# Patient Record
Sex: Male | Born: 1950
Health system: Southern US, Community
[De-identification: ages and names within clinical notes are randomized; demographics above are authoritative.]

## PROBLEM LIST (undated history)

## (undated) DIAGNOSIS — H33311 Horseshoe tear of retina without detachment, right eye: Secondary | ICD-10-CM

## (undated) DIAGNOSIS — E785 Hyperlipidemia, unspecified: Secondary | ICD-10-CM

## (undated) DIAGNOSIS — Z86010 Personal history of colonic polyps: Secondary | ICD-10-CM

## (undated) DIAGNOSIS — Z860101 Personal history of adenomatous and serrated colon polyps: Secondary | ICD-10-CM

## (undated) DIAGNOSIS — F419 Anxiety disorder, unspecified: Secondary | ICD-10-CM

## (undated) DIAGNOSIS — F32A Depression, unspecified: Secondary | ICD-10-CM

## (undated) DIAGNOSIS — K219 Gastro-esophageal reflux disease without esophagitis: Secondary | ICD-10-CM

## (undated) HISTORY — DX: Horseshoe tear of retina without detachment, right eye: H33.311

## (undated) HISTORY — DX: Anxiety disorder, unspecified: F41.9

## (undated) HISTORY — PX: WISDOM TOOTH EXTRACTION: SHX21

## (undated) HISTORY — DX: Depression, unspecified: F32.A

## (undated) HISTORY — PX: LACERATION REPAIR: SHX5168

## (undated) HISTORY — DX: Gastro-esophageal reflux disease without esophagitis: K21.9

## (undated) HISTORY — DX: Personal history of colonic polyps: Z86.010

## (undated) HISTORY — DX: Hyperlipidemia, unspecified: E78.5

## (undated) HISTORY — DX: Personal history of adenomatous and serrated colon polyps: Z86.0101

---

## 2003-10-14 ENCOUNTER — Encounter: Payer: Self-pay | Admitting: Gastroenterology

## 2003-11-09 ENCOUNTER — Ambulatory Visit (HOSPITAL_COMMUNITY): Admission: RE | Admit: 2003-11-09 | Discharge: 2003-11-09 | Payer: Self-pay | Admitting: Gastroenterology

## 2003-11-09 ENCOUNTER — Encounter (INDEPENDENT_AMBULATORY_CARE_PROVIDER_SITE_OTHER): Payer: Self-pay | Admitting: *Deleted

## 2003-11-09 ENCOUNTER — Encounter: Payer: Self-pay | Admitting: Gastroenterology

## 2008-05-27 ENCOUNTER — Encounter: Payer: Self-pay | Admitting: Family Medicine

## 2008-06-29 ENCOUNTER — Ambulatory Visit: Payer: Self-pay | Admitting: Family Medicine

## 2008-06-29 DIAGNOSIS — E785 Hyperlipidemia, unspecified: Secondary | ICD-10-CM | POA: Insufficient documentation

## 2009-01-31 ENCOUNTER — Ambulatory Visit: Payer: Self-pay | Admitting: Family Medicine

## 2009-02-03 ENCOUNTER — Encounter: Payer: Self-pay | Admitting: Family Medicine

## 2009-02-03 LAB — CONVERTED CEMR LAB
ALT: 17 units/L (ref 0–53)
AST: 23 units/L (ref 0–37)
Alkaline Phosphatase: 46 units/L (ref 39–117)
Basophils Absolute: 0 10*3/uL (ref 0.0–0.1)
CO2: 30 meq/L (ref 19–32)
Chloride: 112 meq/L (ref 96–112)
Creatinine, Ser: 1 mg/dL (ref 0.4–1.5)
Eosinophils Relative: 1 % (ref 0.0–5.0)
Glucose, Bld: 93 mg/dL (ref 70–99)
Hemoglobin: 13.8 g/dL (ref 13.0–17.0)
LDL Cholesterol: 111 mg/dL — ABNORMAL HIGH (ref 0–99)
Lymphocytes Relative: 28.6 % (ref 12.0–46.0)
Monocytes Relative: 6.9 % (ref 3.0–12.0)
Neutro Abs: 3.3 10*3/uL (ref 1.4–7.7)
Platelets: 162 10*3/uL (ref 150.0–400.0)
RDW: 12.5 % (ref 11.5–14.6)
Specific Gravity, Urine: 1.01 (ref 1.000–1.030)
Total Bilirubin: 1.6 mg/dL — ABNORMAL HIGH (ref 0.3–1.2)
Total CHOL/HDL Ratio: 4
Total Protein, Urine: NEGATIVE mg/dL
Triglycerides: 98 mg/dL (ref 0.0–149.0)
Urine Glucose: NEGATIVE mg/dL
WBC: 5 10*3/uL (ref 4.5–10.5)

## 2009-02-07 ENCOUNTER — Ambulatory Visit: Payer: Self-pay | Admitting: Family Medicine

## 2009-02-17 ENCOUNTER — Ambulatory Visit: Payer: Self-pay | Admitting: Gastroenterology

## 2009-09-19 ENCOUNTER — Encounter (INDEPENDENT_AMBULATORY_CARE_PROVIDER_SITE_OTHER): Payer: Self-pay | Admitting: *Deleted

## 2009-09-19 ENCOUNTER — Ambulatory Visit: Payer: Self-pay | Admitting: Gastroenterology

## 2009-10-03 ENCOUNTER — Ambulatory Visit: Payer: Self-pay | Admitting: Gastroenterology

## 2009-10-03 HISTORY — PX: COLONOSCOPY: SHX174

## 2009-10-03 LAB — HM COLONOSCOPY

## 2010-09-25 NOTE — Procedures (Signed)
Summary: Herbert Moors MD  Herbert Moors MD   Imported By: Lester [place] 09/28/2009 08:38:45  _____________________________________________________________________  External Attachment:    Type:   Image     Comment:   External Document

## 2010-09-25 NOTE — Procedures (Signed)
Summary: Colonoscopy  Patient: Christian Martin Note: All result statuses are Final unless otherwise noted.  Tests: (1) Colonoscopy (COL)   COL Colonoscopy           DONE     [facility]     [address]     [place], [place]  [address]           COLONOSCOPY PROCEDURE REPORT           PATIENT:  Christian, Martin  MR#:  [id number]     BIRTHDATE:  04-22-1951, 58 yrs. old  GENDER:  male           ENDOSCOPIST:  Barbette Hair. Arlyce Dice, MD     Referred by:  Tera Mater Clent Ridges, M.D.           PROCEDURE DATE:  10/03/2009     PROCEDURE:  Colonoscopy, Diagnostic     ASA CLASS:  Class I     INDICATIONS:  history of pre-cancerous (adenomatous) colon polyps                 MEDICATIONS:   Fentanyl 75 mcg IV, Versed 7 mg IV           DESCRIPTION OF PROCEDURE:   After the risks benefits and     alternatives of the procedure were thoroughly explained, informed     consent was obtained.  Digital rectal exam was performed and     revealed no abnormalities.   The LB CF-[id number] endoscope     was introduced through the anus and advanced to the cecum, which     was identified by both the appendix and ileocecal valve, without     limitations.  The quality of the prep was excellent, using     MoviPrep.  The instrument was then slowly withdrawn as the colon     was fully examined.     <<PROCEDUREIMAGES>>           FINDINGS:  A normal appearing cecum, ileocecal valve, and     appendiceal orifice were identified. The ascending, hepatic     flexure, transverse, splenic flexure, descending, sigmoid colon,     and rectum appeared unremarkable (see image1, image2, image4,     image6, image7, image9, image12, image13, image14, and image15).     Retroflexed views in the rectum revealed no abnormalities.    The     scope was then withdrawn from the patient and the procedure     completed.           COMPLICATIONS:  None           ENDOSCOPIC IMPRESSION:     1) Normal colon     RECOMMENDATIONS:     1)  colonoscopy in 7 years           REPEAT EXAM:  In 7 year(s) for Colonoscopy.           ______________________________     Barbette Hair. Arlyce Dice, MD           CC:           n.     eSIGNED:   Barbette Hair. Kaplan at 10/03/2009 08:29 AM           Katha Cabal, [id number]  Note: An exclamation mark (!) indicates a result that was not dispersed into the flowsheet. Document Creation Date: 10/03/2009 8:29 AM _______________________________________________________________________  (1) Order result status: Final Collection or observation date-time: 10/03/2009 08:24 Requested date-time:  Receipt date-time:  Reported date-time:  Referring Physician:   Ordering Physician: Melvia Heaps [phone]) Specimen Source:  Source: Launa Grill Order Number: [id number] Lab site:   Appended Document: Colonoscopy    Clinical Lists Changes  Observations: Added new observation of COLONNXTDUE: 09/2016 (10/03/2009 11:20)

## 2010-09-25 NOTE — Miscellaneous (Signed)
Summary: [facility] Previsit/prep  Clinical Lists Changes  Medications: Added new medication of MOVIPREP 100 GM  SOLR (PEG-KCL-NACL-NASULF-NA ASC-C) As per prep instructions. - Signed Rx of MOVIPREP 100 GM  SOLR (PEG-KCL-NACL-NASULF-NA ASC-C) As per prep instructions.;  #1 x 0;  Signed;  Entered by: Wyona Almas RN;  Authorized by: Louis Meckel MD;  Method used: Electronically to [facility]  [address] [phone]*, [address] [place], [place]  [address], Ph: [phone] or [phone], Fax: [phone] Observations: Added new observation of ALLERGY REV: Done (09/19/2009 7:55)    Prescriptions: MOVIPREP 100 GM  SOLR (PEG-KCL-NACL-NASULF-NA ASC-C) As per prep instructions.  #1 x 0   Entered by:   Wyona Almas RN   Authorized by:   Louis Meckel MD   Signed by:   Wyona Almas RN on 09/19/2009   Method used:   [facility]ronically to        [facility]  [address] [phone]* (retail)       [address]       [place], [place]  [address]       Ph: [phone] or [phone]       Fax: [phone]   RxID:   [id number]

## 2010-09-25 NOTE — Letter (Signed)
Summary: [facility]  [facility]   Imported By: Lester Lucas 09/28/2009 08:31:14  _____________________________________________________________________  External Attachment:    Type:   Image     Comment:   External Document

## 2010-09-25 NOTE — Letter (Signed)
Summary: [facility] Instructions  [facility]  [address] [place], [place] [address]   Phone: [phone]  Fax: [phone]       Christian Martin    [birth year]    MRN: [id number]       Procedure Day Dorna Bloom:  Jake Shark  10/03/09     Arrival Time:  7:30AM     Procedure Time:  8:00AM     Location of Procedure:                    _ X_  [facility] (4th Floor)    PREPARATION FOR COLONOSCOPY WITH MIRALAX  Starting 5 days prior to your procedure 09/28/09 do not eat nuts, seeds, popcorn, corn, beans, peas,  salads, or any raw vegetables.  Do not take any fiber supplements (e.g. Metamucil, Citrucel, and Benefiber). ____________________________________________________________________________________________________   THE DAY BEFORE YOUR PROCEDURE         DATE: 10/02/09 DAY: MONDAY  1   Drink clear liquids the entire day-NO SOLID FOOD  2   Do not drink anything colored red or purple.  Avoid juices with pulp.  No orange juice.  3   Drink at least 64 oz. (8 glasses) of fluid/clear liquids during the day to prevent dehydration and help the prep work efficiently.  CLEAR LIQUIDS INCLUDE: Water Jello Ice Popsicles Tea (sugar ok, no milk/cream) Powdered fruit flavored drinks Coffee (sugar ok, no milk/cream) Gatorade Juice: apple, white grape, white cranberry  Lemonade Clear bullion, consomm, broth Carbonated beverages (any kind) Strained chicken noodle soup Hard Candy  4   Mix the entire bottle of Miralax with 64 oz. of Gatorade/Powerade in the morning and put in the refrigerator to chill.  5   At 3:00 pm take 2 Dulcolax/Bisacodyl tablets.  6   At 4:30 pm take one Reglan/Metoclopramide tablet.  7  Starting at 5:00 pm drink one 8 oz glass of the Miralax mixture every 15-20 minutes until you have finished drinking the entire 64 oz.  You should finish drinking prep around 7:30 or 8:00 pm.  8   If you are nauseated, you may take the 2nd Reglan/Metoclopramide tablet  at 6:30 pm.        9    At 8:00 pm take 2 more DULCOLAX/Bisacodyl tablets.     THE DAY OF YOUR PROCEDURE      DATE:  10/03/09 DAY: Jake Shark  You may drink clear liquids until 6:00AM  (2 HOURS BEFORE PROCEDURE).   MEDICATION INSTRUCTIONS  Unless otherwise instructed, you should take regular prescription medications with a small sip of water as early as possible the morning of your procedure.           OTHER INSTRUCTIONS  You will need a responsible adult at least 62 years of age to accompany you and drive you home.   This person must remain in the waiting room during your procedure.  Wear loose fitting clothing that is easily removed.  Leave jewelry and other valuables at home.  However, you may wish to bring a book to read or an iPod/MP3 player to listen to music as you wait for your procedure to start.  Remove all body piercing jewelry and leave at home.  Total time from sign-in until discharge is approximately 2-3 hours.  You should go home directly after your procedure and rest.  You can resume normal activities the day after your procedure.  The day of your procedure you should not:   Drive  Make legal decisions   Operate machinery   Drink alcohol   Return to work  You will receive specific instructions about eating, activities and medications before you leave.   The above instructions have been reviewed and explained to me by   Wyona Almas RN  September 19, 2009 8:24 AM     I fully understand and can verbalize these instructions _____________________________ Date _______

## 2010-09-26 DEATH — deceased

## 2010-12-12 ENCOUNTER — Other Ambulatory Visit (INDEPENDENT_AMBULATORY_CARE_PROVIDER_SITE_OTHER): Payer: BC Managed Care – PPO | Admitting: Family Medicine

## 2010-12-12 DIAGNOSIS — Z0000 Encounter for general adult medical examination without abnormal findings: Secondary | ICD-10-CM

## 2010-12-12 LAB — BASIC METABOLIC PANEL
CO2: 30 mEq/L (ref 19–32)
Chloride: 103 mEq/L (ref 96–112)
GFR: 82.38 mL/min (ref >60.00)
Glucose, Bld: 88 mg/dL (ref 70–99)
Potassium: 4.3 mEq/L (ref 3.5–5.1)
Sodium: 142 mEq/L (ref 135–145)

## 2010-12-12 LAB — CBC WITH DIFFERENTIAL/PLATELET
Basophils Absolute: 0 10*3/uL (ref 0.0–0.1)
HCT: 42.3 % (ref 39.0–52.0)
Hemoglobin: 14.5 g/dL (ref 13.0–17.0)
Lymphs Abs: 1.5 10*3/uL (ref 0.7–4.0)
MCHC: 34.3 g/dL (ref 30.0–36.0)
MCV: 94.1 fl (ref 78.0–100.0)
Monocytes Relative: 7.1 % (ref 3.0–12.0)
Neutro Abs: 3.3 10*3/uL (ref 1.4–7.7)
RDW: 12.6 % (ref 11.5–14.6)

## 2010-12-12 LAB — LIPID PANEL
Cholesterol: 190 mg/dL (ref 0–200)
VLDL: 14 mg/dL (ref 0.0–40.0)

## 2010-12-12 LAB — HEPATIC FUNCTION PANEL
AST: 21 U/L (ref 0–37)
Albumin: 4.1 g/dL (ref 3.5–5.2)

## 2010-12-13 ENCOUNTER — Telehealth: Payer: Self-pay

## 2010-12-13 NOTE — Telephone Encounter (Signed)
Pt aware.

## 2010-12-13 NOTE — Telephone Encounter (Signed)
Message copied by Madison Hickman on Thu Dec 13, 2010 10:08 AM ------      Message from: Dwaine Deter      Created: Thu Dec 13, 2010  8:20 AM       Normal except elevated chol. Watch the diet

## 2010-12-18 ENCOUNTER — Encounter: Payer: Self-pay | Admitting: Family Medicine

## 2010-12-19 ENCOUNTER — Encounter: Payer: Self-pay | Admitting: Family Medicine

## 2010-12-19 ENCOUNTER — Ambulatory Visit (INDEPENDENT_AMBULATORY_CARE_PROVIDER_SITE_OTHER): Payer: BC Managed Care – PPO | Admitting: Family Medicine

## 2010-12-19 VITALS — BP 106/74 | HR 100 | Temp 99.1°F | Ht 70.5 in | Wt 162.0 lb

## 2010-12-19 DIAGNOSIS — Z136 Encounter for screening for cardiovascular disorders: Secondary | ICD-10-CM

## 2010-12-19 DIAGNOSIS — Z0000 Encounter for general adult medical examination without abnormal findings: Secondary | ICD-10-CM

## 2010-12-19 DIAGNOSIS — Z86010 Personal history of colonic polyps: Secondary | ICD-10-CM

## 2010-12-19 LAB — POCT URINALYSIS DIPSTICK
Bilirubin, UA: NEGATIVE
Blood, UA: NEGATIVE
Glucose, UA: NEGATIVE
Ketones, UA: NEGATIVE
Leukocytes, UA: NEGATIVE
Nitrite, UA: NEGATIVE
Protein, UA: NEGATIVE
Spec Grav, UA: 1.015
Urobilinogen, UA: 0.2
pH, UA: 5

## 2010-12-19 NOTE — Progress Notes (Signed)
  Subjective:    Patient ID: Christian Martin, male    DOB: 31-Jan-1951, 60 y.o.   MRN: [id number]  HPI 60 yr old male for a cpx. He feels fine and has no complaints.   Review of Systems  Constitutional: Negative.   HENT: Negative.   Eyes: Negative.   Respiratory: Negative.   Cardiovascular: Negative.   Gastrointestinal: Negative.   Genitourinary: Negative.   Musculoskeletal: Negative.   Skin: Negative.   Neurological: Negative.   Hematological: Negative.   Psychiatric/Behavioral: Negative.        Objective:   Physical Exam  Constitutional: He is oriented to person, place, and time. He appears well-developed and well-nourished. No distress.  HENT:  Head: Normocephalic and atraumatic.  Right Ear: External ear normal.  Left Ear: External ear normal.  Nose: Nose normal.  Mouth/Throat: Oropharynx is clear and moist. No oropharyngeal exudate.  Eyes: Conjunctivae and EOM are normal. Pupils are equal, round, and reactive to light. Right eye exhibits no discharge. Left eye exhibits no discharge. No scleral icterus.  Neck: Neck supple. No JVD present. No tracheal deviation present. No thyromegaly present.  Cardiovascular: Normal rate, regular rhythm, normal heart sounds and intact distal pulses.  Exam reveals no gallop and no friction rub.   No murmur heard.      EKG normal  Pulmonary/Chest: Effort normal and breath sounds normal. No respiratory distress. He has no wheezes. He has no rales. He exhibits no tenderness.  Abdominal: Soft. Bowel sounds are normal. He exhibits no distension and no mass. There is no tenderness. There is no rebound and no guarding.  Genitourinary: Rectum normal, prostate normal and penis normal. Guaiac negative stool. No penile tenderness.  Musculoskeletal: Normal range of motion. He exhibits no edema and no tenderness.  Lymphadenopathy:    He has no cervical adenopathy.  Neurological: He is alert and oriented to person, place, and time. He has normal reflexes.  No cranial nerve deficit. He exhibits normal muscle tone. Coordination normal.  Skin: Skin is warm and dry. No rash noted. He is not diaphoretic. No erythema. No pallor.  Psychiatric: He has a normal mood and affect. His behavior is normal. Judgment and thought content normal.          Assessment & Plan:  Doing well. Get some exercise

## 2011-01-11 NOTE — Op Note (Signed)
NAME:  Christian Martin, Christian Martin                         ACCOUNT NO.:  [id number]   MEDICAL RECORD NO.:  [id number]                   PATIENT TYPE:  AMB   LOCATION:  ENDO                                 FACILITY:  [facility]   PHYSICIAN:  Graylin Shiver, M.D.                DATE OF BIRTH:  [birth year]   DATE OF PROCEDURE:  11/09/2003  DATE OF DISCHARGE:                                 OPERATIVE REPORT   PROCEDURE:  Colonoscopy with biopsy.   INDICATIONS:  Screening.   Informed consent was obtained after explanation of the risks of bleeding,  infection, and perforation.   PREMEDICATION:  Fentanyl 60 mcg IV, Versed 7 mg IV.   DESCRIPTION OF PROCEDURE:  With the patient in the left lateral decubitus  position, a rectal exam was performed and no masses were felt.  The Olympus  colonoscope was inserted into the rectum and advanced around the colon to  the cecum.  Cecal landmarks were identified.  The cecum and ascending colon  were normal.  The transverse colon was normal.  The descending colon was  normal.  In the proximal sigmoid colon there was a small 2 mm sessile polyp  biopsied off with cold forceps.  The rectum was normal .  He tolerated the  procedure well without complications.   IMPRESSION:  Small sigmoid polyp.   PLAN:  The pathology will be checked.                                               Graylin Shiver, M.D.    SFG/MEDQ  D:  11/09/2003  T:  11/10/2003  Job:  [id number]   cc:   Caryn Bee L. Little, M.D.  [address]  [place]  [place] [address]  Fax: [phone]

## 2011-12-13 ENCOUNTER — Other Ambulatory Visit (INDEPENDENT_AMBULATORY_CARE_PROVIDER_SITE_OTHER): Payer: BC Managed Care – PPO

## 2011-12-13 DIAGNOSIS — Z0000 Encounter for general adult medical examination without abnormal findings: Secondary | ICD-10-CM

## 2011-12-13 LAB — LIPID PANEL
HDL: 48.2 mg/dL (ref >39.00)
Triglycerides: 85 mg/dL (ref 0.0–149.0)
VLDL: 17 mg/dL (ref 0.0–40.0)

## 2011-12-13 LAB — HEPATIC FUNCTION PANEL
ALT: 24 U/L (ref 0–53)
AST: 25 U/L (ref 0–37)
Alkaline Phosphatase: 57 U/L (ref 39–117)
Bilirubin, Direct: 0.2 mg/dL (ref 0.0–0.3)
Total Bilirubin: 1.7 mg/dL — ABNORMAL HIGH (ref 0.3–1.2)
Total Protein: 6.9 g/dL (ref 6.0–8.3)

## 2011-12-13 LAB — CBC WITH DIFFERENTIAL/PLATELET
Basophils Relative: 0.3 % (ref 0.0–3.0)
Eosinophils Relative: 0.5 % (ref 0.0–5.0)
Monocytes Relative: 8.2 % (ref 3.0–12.0)
Neutrophils Relative %: 62.6 % (ref 43.0–77.0)
Platelets: 173 10*3/uL (ref 150.0–400.0)
RBC: 4.62 Mil/uL (ref 4.22–5.81)
WBC: 6.7 10*3/uL (ref 4.5–10.5)

## 2011-12-13 LAB — POCT URINALYSIS DIPSTICK
Bilirubin, UA: NEGATIVE
Blood, UA: NEGATIVE
Glucose, UA: NEGATIVE
Ketones, UA: NEGATIVE
Nitrite, UA: NEGATIVE
Spec Grav, UA: <=1.005

## 2011-12-13 LAB — BASIC METABOLIC PANEL
BUN: 22 mg/dL (ref 6–23)
Chloride: 103 mEq/L (ref 96–112)
Creatinine, Ser: 1.2 mg/dL (ref 0.4–1.5)
GFR: 68.27 mL/min (ref >60.00)
Potassium: 4.6 mEq/L (ref 3.5–5.1)

## 2011-12-13 LAB — LDL CHOLESTEROL, DIRECT: Direct LDL: 137 mg/dL

## 2011-12-17 NOTE — Progress Notes (Signed)
Quick Note:  Pt informed on personally identified VM ______ 

## 2011-12-23 ENCOUNTER — Encounter: Payer: BC Managed Care – PPO | Admitting: Family Medicine

## 2011-12-27 ENCOUNTER — Ambulatory Visit (INDEPENDENT_AMBULATORY_CARE_PROVIDER_SITE_OTHER): Payer: BC Managed Care – PPO | Admitting: Family Medicine

## 2011-12-27 ENCOUNTER — Encounter: Payer: Self-pay | Admitting: Family Medicine

## 2011-12-27 VITALS — BP 108/74 | HR 80 | Temp 98.8°F | Ht 70.75 in | Wt 174.0 lb

## 2011-12-27 DIAGNOSIS — Z0000 Encounter for general adult medical examination without abnormal findings: Secondary | ICD-10-CM

## 2011-12-27 NOTE — Progress Notes (Signed)
  Subjective:    Patient ID: Christian Martin, male    DOB: Dec 17, 1950, 61 y.o.   MRN: [id number]  HPI 61 yr old male for a cpx. He feels fine and has no concerns.    Review of Systems  Constitutional: Negative.   HENT: Negative.   Eyes: Negative.   Respiratory: Negative.   Cardiovascular: Negative.   Gastrointestinal: Negative.   Genitourinary: Negative.   Musculoskeletal: Negative.   Skin: Negative.   Neurological: Negative.   Hematological: Negative.   Psychiatric/Behavioral: Negative.        Objective:   Physical Exam  Constitutional: He is oriented to person, place, and time. He appears well-developed and well-nourished. No distress.  HENT:  Head: Normocephalic and atraumatic.  Right Ear: External ear normal.  Left Ear: External ear normal.  Nose: Nose normal.  Mouth/Throat: Oropharynx is clear and moist. No oropharyngeal exudate.  Eyes: Conjunctivae and EOM are normal. Pupils are equal, round, and reactive to light. Right eye exhibits no discharge. Left eye exhibits no discharge. No scleral icterus.  Neck: Neck supple. No JVD present. No tracheal deviation present. No thyromegaly present.  Cardiovascular: Normal rate, regular rhythm, normal heart sounds and intact distal pulses.  Exam reveals no gallop and no friction rub.   No murmur heard.      EKG normal   Pulmonary/Chest: Effort normal and breath sounds normal. No respiratory distress. He has no wheezes. He has no rales. He exhibits no tenderness.  Abdominal: Soft. Bowel sounds are normal. He exhibits no distension and no mass. There is no tenderness. There is no rebound and no guarding.  Genitourinary: Rectum normal, prostate normal and penis normal. Guaiac negative stool. No penile tenderness.  Musculoskeletal: Normal range of motion. He exhibits no edema and no tenderness.  Lymphadenopathy:    He has no cervical adenopathy.  Neurological: He is alert and oriented to person, place, and time. He has normal  reflexes. No cranial nerve deficit. He exhibits normal muscle tone. Coordination normal.  Skin: Skin is warm and dry. No rash noted. He is not diaphoretic. No erythema. No pallor.  Psychiatric: He has a normal mood and affect. His behavior is normal. Judgment and thought content normal.          Assessment & Plan:  Well exam.

## 2012-12-31 ENCOUNTER — Other Ambulatory Visit (INDEPENDENT_AMBULATORY_CARE_PROVIDER_SITE_OTHER): Payer: BC Managed Care – PPO

## 2012-12-31 ENCOUNTER — Ambulatory Visit (INDEPENDENT_AMBULATORY_CARE_PROVIDER_SITE_OTHER): Payer: BC Managed Care – PPO | Admitting: Family Medicine

## 2012-12-31 ENCOUNTER — Encounter: Payer: Self-pay | Admitting: Family Medicine

## 2012-12-31 VITALS — BP 110/74 | HR 87 | Temp 98.1°F | Wt 178.0 lb

## 2012-12-31 DIAGNOSIS — L259 Unspecified contact dermatitis, unspecified cause: Secondary | ICD-10-CM

## 2012-12-31 DIAGNOSIS — Z0000 Encounter for general adult medical examination without abnormal findings: Secondary | ICD-10-CM

## 2012-12-31 LAB — CBC WITH DIFFERENTIAL/PLATELET
Basophils Relative: 0.5 % (ref 0.0–3.0)
Eosinophils Relative: 1.6 % (ref 0.0–5.0)
Lymphocytes Relative: 33.9 % (ref 12.0–46.0)
Monocytes Absolute: 0.5 10*3/uL (ref 0.1–1.0)
Monocytes Relative: 8.3 % (ref 3.0–12.0)
Neutrophils Relative %: 55.7 % (ref 43.0–77.0)
Platelets: 187 10*3/uL (ref 150.0–400.0)
RBC: 4.66 Mil/uL (ref 4.22–5.81)
WBC: 6.1 10*3/uL (ref 4.5–10.5)

## 2012-12-31 LAB — HEPATIC FUNCTION PANEL
ALT: 22 U/L (ref 0–53)
AST: 23 U/L (ref 0–37)
Bilirubin, Direct: 0.2 mg/dL (ref 0.0–0.3)
Total Bilirubin: 1.6 mg/dL — ABNORMAL HIGH (ref 0.3–1.2)
Total Protein: 6.6 g/dL (ref 6.0–8.3)

## 2012-12-31 LAB — LIPID PANEL
Cholesterol: 214 mg/dL — ABNORMAL HIGH (ref 0–200)
HDL: 46.3 mg/dL (ref >39.00)
Total CHOL/HDL Ratio: 5
Triglycerides: 102 mg/dL (ref 0.0–149.0)

## 2012-12-31 LAB — POCT URINALYSIS DIPSTICK
Bilirubin, UA: NEGATIVE
Blood, UA: NEGATIVE
Leukocytes, UA: NEGATIVE
Nitrite, UA: NEGATIVE
Protein, UA: NEGATIVE
pH, UA: 6

## 2012-12-31 LAB — BASIC METABOLIC PANEL
BUN: 16 mg/dL (ref 6–23)
Calcium: 8.9 mg/dL (ref 8.4–10.5)
Chloride: 103 mEq/L (ref 96–112)
Creatinine, Ser: 1.3 mg/dL (ref 0.4–1.5)
GFR: 60.13 mL/min (ref >60.00)

## 2012-12-31 MED ORDER — PREDNISONE 10 MG PO TABS: ORAL_TABLET | ORAL | Status: DC

## 2012-12-31 MED ORDER — METHYLPREDNISOLONE ACETATE 80 MG/ML IJ SUSP
120.0000 mg | Freq: Once | INTRAMUSCULAR | Status: AC
  Administered 2012-12-31: 120 mg via INTRAMUSCULAR

## 2012-12-31 NOTE — Addendum Note (Signed)
Addended by: Aniceto Boss A on: 12/31/2012 10:16 AM   Modules accepted: Orders

## 2012-12-31 NOTE — Progress Notes (Signed)
  Subjective:    Patient ID: Christian Martin, male    DOB: Mar 24, 1951, 62 y.o.   MRN: [id number]  HPI Here for 5 days of poison ivy rash over the trunk and arms. It is extremely itchy despite using oral Benadryl and Calamine cream.    Review of Systems  Constitutional: Negative.        Objective:   Physical Exam  Constitutional: He appears well-developed and well-nourished.  Skin:  Diffuse red papulovesicular rash as above           Assessment & Plan:  Recheck prn.

## 2013-01-04 NOTE — Progress Notes (Signed)
Quick Note:  Pt has appointment on 01/07/13 will go over then. ______

## 2013-01-07 ENCOUNTER — Encounter: Payer: Self-pay | Admitting: Family Medicine

## 2013-01-07 ENCOUNTER — Ambulatory Visit (INDEPENDENT_AMBULATORY_CARE_PROVIDER_SITE_OTHER): Payer: BC Managed Care – PPO | Admitting: Family Medicine

## 2013-01-07 VITALS — BP 118/68 | HR 88 | Temp 98.7°F | Ht 69.5 in | Wt 177.0 lb

## 2013-01-07 DIAGNOSIS — Z0000 Encounter for general adult medical examination without abnormal findings: Secondary | ICD-10-CM

## 2013-01-07 NOTE — Progress Notes (Signed)
  Subjective:    Patient ID: Christian Martin, male    DOB: [birth year], 62 y.o.   MRN: [id number]  HPI 62 yr old male for a cpx. He feels great and has no concerns. His poison ivy is clearing up.    Review of Systems  Constitutional: Negative.   HENT: Negative.   Eyes: Negative.   Respiratory: Negative.   Cardiovascular: Negative.   Gastrointestinal: Negative.   Genitourinary: Negative.   Musculoskeletal: Negative.   Skin: Negative.   Neurological: Negative.   Psychiatric/Behavioral: Negative.        Objective:   Physical Exam  Constitutional: He is oriented to person, place, and time. He appears well-developed and well-nourished. No distress.  HENT:  Head: Normocephalic and atraumatic.  Right Ear: External ear normal.  Left Ear: External ear normal.  Nose: Nose normal.  Mouth/Throat: Oropharynx is clear and moist. No oropharyngeal exudate.  Eyes: Conjunctivae and EOM are normal. Pupils are equal, round, and reactive to light. Right eye exhibits no discharge. Left eye exhibits no discharge. No scleral icterus.  Neck: Neck supple. No JVD present. No tracheal deviation present. No thyromegaly present.  Cardiovascular: Normal rate, regular rhythm, normal heart sounds and intact distal pulses.  Exam reveals no gallop and no friction rub.   No murmur heard. EKG normal   Pulmonary/Chest: Effort normal and breath sounds normal. No respiratory distress. He has no wheezes. He has no rales. He exhibits no tenderness.  Abdominal: Soft. Bowel sounds are normal. He exhibits no distension and no mass. There is no tenderness. There is no rebound and no guarding.  Genitourinary: Rectum normal, prostate normal and penis normal. Guaiac negative stool. No penile tenderness.  Musculoskeletal: Normal range of motion. He exhibits no edema and no tenderness.  Lymphadenopathy:    He has no cervical adenopathy.  Neurological: He is alert and oriented to person, place, and time. He has normal reflexes.  No cranial nerve deficit. He exhibits normal muscle tone. Coordination normal.  Skin: Skin is warm and dry. No rash noted. He is not diaphoretic. No erythema. No pallor.  Psychiatric: He has a normal mood and affect. His behavior is normal. Judgment and thought content normal.          Assessment & Plan:  Well exam.

## 2013-01-08 ENCOUNTER — Ambulatory Visit (INDEPENDENT_AMBULATORY_CARE_PROVIDER_SITE_OTHER): Payer: BC Managed Care – PPO | Admitting: Family Medicine

## 2013-01-08 DIAGNOSIS — Z23 Encounter for immunization: Secondary | ICD-10-CM

## 2013-01-08 DIAGNOSIS — Z2911 Encounter for prophylactic immunotherapy for respiratory syncytial virus (RSV): Secondary | ICD-10-CM

## 2014-01-03 ENCOUNTER — Other Ambulatory Visit (INDEPENDENT_AMBULATORY_CARE_PROVIDER_SITE_OTHER): Payer: Medicare HMO

## 2014-01-03 DIAGNOSIS — Z0000 Encounter for general adult medical examination without abnormal findings: Secondary | ICD-10-CM

## 2014-01-03 DIAGNOSIS — E785 Hyperlipidemia, unspecified: Secondary | ICD-10-CM

## 2014-01-03 DIAGNOSIS — Z125 Encounter for screening for malignant neoplasm of prostate: Secondary | ICD-10-CM

## 2014-01-03 LAB — CBC WITH DIFFERENTIAL/PLATELET
Basophils Absolute: 0 10*3/uL (ref 0.0–0.1)
Basophils Relative: 0.3 % (ref 0.0–3.0)
EOS PCT: 0.9 % (ref 0.0–5.0)
Eosinophils Absolute: 0.1 10*3/uL (ref 0.0–0.7)
HEMATOCRIT: 43.3 % (ref 39.0–52.0)
HEMOGLOBIN: 14.5 g/dL (ref 13.0–17.0)
LYMPHS ABS: 1.9 10*3/uL (ref 0.7–4.0)
Lymphocytes Relative: 33.1 % (ref 12.0–46.0)
MCHC: 33.4 g/dL (ref 30.0–36.0)
MCV: 93 fl (ref 78.0–100.0)
MONOS PCT: 6.3 % (ref 3.0–12.0)
Monocytes Absolute: 0.4 10*3/uL (ref 0.1–1.0)
NEUTROS ABS: 3.5 10*3/uL (ref 1.4–7.7)
Neutrophils Relative %: 59.4 % (ref 43.0–77.0)
PLATELETS: 194 10*3/uL (ref 150.0–400.0)
RBC: 4.66 Mil/uL (ref 4.22–5.81)
RDW: 12.9 % (ref 11.5–15.5)
WBC: 5.9 10*3/uL (ref 4.0–10.5)

## 2014-01-03 LAB — POCT URINALYSIS DIPSTICK
BILIRUBIN UA: NEGATIVE
Glucose, UA: NEGATIVE
Ketones, UA: NEGATIVE
Leukocytes, UA: NEGATIVE
NITRITE UA: NEGATIVE
PH UA: 6.5
Protein, UA: NEGATIVE
RBC UA: NEGATIVE
SPEC GRAV UA: 1.02
Urobilinogen, UA: 0.2

## 2014-01-03 LAB — HEPATIC FUNCTION PANEL
ALK PHOS: 55 U/L (ref 39–117)
ALT: 29 U/L (ref 0–53)
AST: 30 U/L (ref 0–37)
Albumin: 4 g/dL (ref 3.5–5.2)
BILIRUBIN TOTAL: 1.2 mg/dL (ref 0.2–1.2)
Bilirubin, Direct: 0.1 mg/dL (ref 0.0–0.3)
Total Protein: 6.8 g/dL (ref 6.0–8.3)

## 2014-01-03 LAB — BASIC METABOLIC PANEL
BUN: 18 mg/dL (ref 6–23)
CO2: 26 mEq/L (ref 19–32)
Calcium: 8.9 mg/dL (ref 8.4–10.5)
Chloride: 105 mEq/L (ref 96–112)
Creatinine, Ser: 1 mg/dL (ref 0.4–1.5)
GFR: 77.9 mL/min (ref >60.00)
Glucose, Bld: 92 mg/dL (ref 70–99)
POTASSIUM: 4.5 meq/L (ref 3.5–5.1)
SODIUM: 138 meq/L (ref 135–145)

## 2014-01-03 LAB — LIPID PANEL
CHOL/HDL RATIO: 4
Cholesterol: 235 mg/dL — ABNORMAL HIGH (ref 0–200)
HDL: 53.2 mg/dL (ref >39.00)
LDL CALC: 155 mg/dL — AB (ref 0–99)
Triglycerides: 132 mg/dL (ref 0.0–149.0)
VLDL: 26.4 mg/dL (ref 0.0–40.0)

## 2014-01-03 LAB — TSH: TSH: 3.46 u[IU]/mL (ref 0.35–4.50)

## 2014-01-03 LAB — PSA: PSA: 0.7 ng/mL (ref 0.10–4.00)

## 2014-01-03 NOTE — Addendum Note (Signed)
Addended by: Elmer Picker on: 01/03/2014 08:58 AM   Modules accepted: Orders

## 2014-01-10 ENCOUNTER — Encounter: Payer: Self-pay | Admitting: Family Medicine

## 2014-01-10 ENCOUNTER — Ambulatory Visit (INDEPENDENT_AMBULATORY_CARE_PROVIDER_SITE_OTHER)
Admission: RE | Admit: 2014-01-10 | Discharge: 2014-01-10 | Disposition: A | Discharge status: Home / Self Care | Payer: Medicare HMO | Source: Ambulatory Visit | Attending: Family Medicine | Admitting: Family Medicine

## 2014-01-10 ENCOUNTER — Ambulatory Visit (INDEPENDENT_AMBULATORY_CARE_PROVIDER_SITE_OTHER): Payer: Medicare HMO | Admitting: Family Medicine

## 2014-01-10 VITALS — BP 129/89 | HR 83 | Temp 99.1°F | Ht 69.75 in | Wt 174.0 lb

## 2014-01-10 DIAGNOSIS — Z23 Encounter for immunization: Secondary | ICD-10-CM

## 2014-01-10 DIAGNOSIS — Z0000 Encounter for general adult medical examination without abnormal findings: Secondary | ICD-10-CM

## 2014-01-10 DIAGNOSIS — R059 Cough, unspecified: Secondary | ICD-10-CM

## 2014-01-10 DIAGNOSIS — R05 Cough: Secondary | ICD-10-CM

## 2014-01-10 DIAGNOSIS — E785 Hyperlipidemia, unspecified: Secondary | ICD-10-CM

## 2014-01-10 NOTE — Progress Notes (Signed)
Pre visit review using our clinic review tool, if applicable. No additional management support is needed unless otherwise documented below in the visit note. 

## 2014-01-10 NOTE — Addendum Note (Signed)
Addended by: Aggie Hacker A on: 01/10/2014 03:30 PM   Modules accepted: Orders

## 2014-01-10 NOTE — Progress Notes (Signed)
   Subjective:    Patient ID: Christian Martin, male    DOB: 1951-06-28, 63 y.o.   MRN: [id number]  HPI 64 yr old male for a cpx. He feels well.    Review of Systems  Constitutional: Negative.   HENT: Negative.   Eyes: Negative.   Respiratory: Negative.   Cardiovascular: Negative.   Gastrointestinal: Negative.   Genitourinary: Negative.   Musculoskeletal: Negative.   Skin: Negative.   Neurological: Negative.   Psychiatric/Behavioral: Negative.        Objective:   Physical Exam  Constitutional: He is oriented to person, place, and time. He appears well-developed and well-nourished. No distress.  HENT:  Head: Normocephalic and atraumatic.  Right Ear: External ear normal.  Left Ear: External ear normal.  Nose: Nose normal.  Mouth/Throat: Oropharynx is clear and moist. No oropharyngeal exudate.  Eyes: Conjunctivae and EOM are normal. Pupils are equal, round, and reactive to light. Right eye exhibits no discharge. Left eye exhibits no discharge. No scleral icterus.  Neck: Neck supple. No JVD present. No tracheal deviation present. No thyromegaly present.  Cardiovascular: Normal rate, regular rhythm, normal heart sounds and intact distal pulses.  Exam reveals no gallop and no friction rub.   No murmur heard. EKG normal   Pulmonary/Chest: Effort normal and breath sounds normal. No respiratory distress. He has no wheezes. He has no rales. He exhibits no tenderness.  Abdominal: Soft. Bowel sounds are normal. He exhibits no distension and no mass. There is no tenderness. There is no rebound and no guarding.  Genitourinary: Rectum normal, prostate normal and penis normal. Guaiac negative stool. No penile tenderness.  Musculoskeletal: Normal range of motion. He exhibits no edema and no tenderness.  Lymphadenopathy:    He has no cervical adenopathy.  Neurological: He is alert and oriented to person, place, and time. He has normal reflexes. No cranial nerve deficit. He exhibits normal  muscle tone. Coordination normal.  Skin: Skin is warm and dry. No rash noted. He is not diaphoretic. No erythema. No pallor.  Psychiatric: He has a normal mood and affect. His behavior is normal. Judgment and thought content normal.          Assessment & Plan:  Well exam. Given a pneumonia vaccine.

## 2014-08-26 DIAGNOSIS — H33311 Horseshoe tear of retina without detachment, right eye: Secondary | ICD-10-CM

## 2014-08-26 HISTORY — DX: Horseshoe tear of retina without detachment, right eye: H33.311

## 2015-01-05 ENCOUNTER — Other Ambulatory Visit (INDEPENDENT_AMBULATORY_CARE_PROVIDER_SITE_OTHER): Payer: Medicare HMO

## 2015-01-05 DIAGNOSIS — Z0000 Encounter for general adult medical examination without abnormal findings: Secondary | ICD-10-CM

## 2015-01-05 DIAGNOSIS — E785 Hyperlipidemia, unspecified: Secondary | ICD-10-CM

## 2015-01-05 LAB — CBC WITH DIFFERENTIAL/PLATELET
Basophils Absolute: 0 10*3/uL (ref 0.0–0.1)
Basophils Relative: 0.6 % (ref 0.0–3.0)
EOS ABS: 0 10*3/uL (ref 0.0–0.7)
EOS PCT: 0.7 % (ref 0.0–5.0)
HCT: 41.7 % (ref 39.0–52.0)
HEMOGLOBIN: 14.4 g/dL (ref 13.0–17.0)
LYMPHS ABS: 1.8 10*3/uL (ref 0.7–4.0)
Lymphocytes Relative: 33.6 % (ref 12.0–46.0)
MCHC: 34.5 g/dL (ref 30.0–36.0)
MCV: 88.8 fl (ref 78.0–100.0)
MONOS PCT: 8.2 % (ref 3.0–12.0)
Monocytes Absolute: 0.4 10*3/uL (ref 0.1–1.0)
NEUTROS ABS: 3 10*3/uL (ref 1.4–7.7)
Neutrophils Relative %: 56.9 % (ref 43.0–77.0)
Platelets: 189 10*3/uL (ref 150.0–400.0)
RBC: 4.69 Mil/uL (ref 4.22–5.81)
RDW: 12.5 % (ref 11.5–15.5)
WBC: 5.2 10*3/uL (ref 4.0–10.5)

## 2015-01-05 LAB — POCT URINALYSIS DIPSTICK
BILIRUBIN UA: NEGATIVE
Glucose, UA: NEGATIVE
Ketones, UA: NEGATIVE
Leukocytes, UA: NEGATIVE
Nitrite, UA: NEGATIVE
PH UA: 7
Protein, UA: NEGATIVE
RBC UA: NEGATIVE
Spec Grav, UA: 1.015
Urobilinogen, UA: 0.2

## 2015-01-05 LAB — PSA: PSA: 0.67 ng/mL (ref 0.10–4.00)

## 2015-01-05 LAB — BASIC METABOLIC PANEL
BUN: 17 mg/dL (ref 6–23)
CO2: 27 mEq/L (ref 19–32)
Calcium: 9.1 mg/dL (ref 8.4–10.5)
Chloride: 104 mEq/L (ref 96–112)
Creatinine, Ser: 1.04 mg/dL (ref 0.40–1.50)
GFR: 75.94 mL/min (ref >60.00)
Glucose, Bld: 104 mg/dL — ABNORMAL HIGH (ref 70–99)
POTASSIUM: 4.1 meq/L (ref 3.5–5.1)
Sodium: 138 mEq/L (ref 135–145)

## 2015-01-05 LAB — LIPID PANEL
CHOLESTEROL: 220 mg/dL — AB (ref 0–200)
HDL: 50.5 mg/dL (ref >39.00)
LDL CALC: 149 mg/dL — AB (ref 0–99)
NonHDL: 169.5
TRIGLYCERIDES: 101 mg/dL (ref 0.0–149.0)
Total CHOL/HDL Ratio: 4
VLDL: 20.2 mg/dL (ref 0.0–40.0)

## 2015-01-05 LAB — HEPATIC FUNCTION PANEL
ALBUMIN: 4.1 g/dL (ref 3.5–5.2)
ALK PHOS: 68 U/L (ref 39–117)
ALT: 21 U/L (ref 0–53)
AST: 19 U/L (ref 0–37)
BILIRUBIN DIRECT: 0.2 mg/dL (ref 0.0–0.3)
BILIRUBIN TOTAL: 1.2 mg/dL (ref 0.2–1.2)
Total Protein: 6.6 g/dL (ref 6.0–8.3)

## 2015-01-05 LAB — TSH: TSH: 2.33 u[IU]/mL (ref 0.35–4.50)

## 2015-01-12 ENCOUNTER — Ambulatory Visit (INDEPENDENT_AMBULATORY_CARE_PROVIDER_SITE_OTHER): Payer: Medicare HMO | Admitting: Family Medicine

## 2015-01-12 ENCOUNTER — Encounter: Payer: Self-pay | Admitting: Family Medicine

## 2015-01-12 VITALS — BP 134/84 | HR 77 | Temp 99.0°F | Ht 69.75 in | Wt 180.0 lb

## 2015-01-12 DIAGNOSIS — Z0000 Encounter for general adult medical examination without abnormal findings: Secondary | ICD-10-CM

## 2015-01-12 DIAGNOSIS — E785 Hyperlipidemia, unspecified: Secondary | ICD-10-CM

## 2015-01-12 NOTE — Progress Notes (Signed)
Pre visit review using our clinic review tool, if applicable. No additional management support is needed unless otherwise documented below in the visit note. 

## 2015-01-12 NOTE — Progress Notes (Signed)
   Subjective:    Patient ID: Christian Martin, male    DOB: [birth year], 64 y.o.   MRN: [id number]  HPI 64 yr old male for a cpx. He feels well except he does mention some occasional pain in the left knee. No swelling. He takes Aleve at times. He has some morning allergies which cause a stuufy nose and sneezing. He takes Claritin at times for this. He has semi-retired and is now working about 3 days a week.    Review of Systems  Constitutional: Negative.   HENT: Negative.   Eyes: Negative.   Respiratory: Negative.   Cardiovascular: Negative.   Gastrointestinal: Negative.   Genitourinary: Negative.   Musculoskeletal: Negative.   Skin: Negative.   Neurological: Negative.   Psychiatric/Behavioral: Negative.        Objective:   Physical Exam  Constitutional: He is oriented to person, place, and time. He appears well-developed and well-nourished. No distress.  HENT:  Head: Normocephalic and atraumatic.  Right Ear: External ear normal.  Left Ear: External ear normal.  Nose: Nose normal.  Mouth/Throat: Oropharynx is clear and moist. No oropharyngeal exudate.  Eyes: Conjunctivae and EOM are normal. Pupils are equal, round, and reactive to light. Right eye exhibits no discharge. Left eye exhibits no discharge. No scleral icterus.  Neck: Neck supple. No JVD present. No tracheal deviation present. No thyromegaly present.  Cardiovascular: Normal rate, regular rhythm, normal heart sounds and intact distal pulses.  Exam reveals no gallop and no friction rub.   No murmur heard. EKG normal   Pulmonary/Chest: Effort normal and breath sounds normal. No respiratory distress. He has no wheezes. He has no rales. He exhibits no tenderness.  Abdominal: Soft. Bowel sounds are normal. He exhibits no distension and no mass. There is no tenderness. There is no rebound and no guarding.  Genitourinary: Rectum normal, prostate normal and penis normal. Guaiac negative stool. No penile tenderness.    Musculoskeletal: Normal range of motion. He exhibits no edema or tenderness.  Lymphadenopathy:    He has no cervical adenopathy.  Neurological: He is alert and oriented to person, place, and time. He has normal reflexes. No cranial nerve deficit. He exhibits normal muscle tone. Coordination normal.  Skin: Skin is warm and dry. No rash noted. He is not diaphoretic. No erythema. No pallor.  Psychiatric: He has a normal mood and affect. His behavior is normal. Judgment and thought content normal.          Assessment & Plan:  Well exam. We discussed reducing the fats and the sugars in his diet.

## 2015-04-21 ENCOUNTER — Encounter: Payer: Self-pay | Admitting: Family Medicine

## 2015-04-21 ENCOUNTER — Ambulatory Visit (INDEPENDENT_AMBULATORY_CARE_PROVIDER_SITE_OTHER): Payer: Medicare HMO | Admitting: Family Medicine

## 2015-04-21 VITALS — BP 124/79 | HR 85 | Temp 98.3°F | Ht 69.75 in | Wt 178.0 lb

## 2015-04-21 DIAGNOSIS — H33311 Horseshoe tear of retina without detachment, right eye: Secondary | ICD-10-CM

## 2015-04-21 DIAGNOSIS — H33001 Unspecified retinal detachment with retinal break, right eye: Secondary | ICD-10-CM | POA: Diagnosis not present

## 2015-04-21 DIAGNOSIS — L858 Other specified epidermal thickening: Secondary | ICD-10-CM

## 2015-04-21 DIAGNOSIS — Z23 Encounter for immunization: Secondary | ICD-10-CM

## 2015-04-21 DIAGNOSIS — M25562 Pain in left knee: Secondary | ICD-10-CM

## 2015-04-21 NOTE — Addendum Note (Signed)
Addended by: Aggie Hacker A on: 04/21/2015 09:36 AM   Modules accepted: Orders

## 2015-04-21 NOTE — Progress Notes (Signed)
   Subjective:    Patient ID: Christian Martin, male    DOB: [birth year], 64 y.o.   MRN: [id number]  HPI Here for a lesion on the right hand that appeared 2 months ago. It is slowing growing and it is tender. His left knee pain is stable for now. He needs immunization updates.    Review of Systems  Constitutional: Negative.   Respiratory: Negative.   Cardiovascular: Negative.   Musculoskeletal: Positive for arthralgias.       Objective:   Physical Exam  Constitutional: He appears well-developed and well-nourished.  Cardiovascular: Normal rate, regular rhythm, normal heart sounds and intact distal pulses.   Pulmonary/Chest: Effort normal and breath sounds normal.  Skin:  There is a 1 cm nodular lesion on the dorsum of the right hand which is smooth and has a central crater           Assessment & Plan:  Keratoacanthoma. Refer to Dermatology to remove.

## 2015-04-21 NOTE — Progress Notes (Signed)
Pre visit review using our clinic review tool, if applicable. No additional management support is needed unless otherwise documented below in the visit note. 

## 2015-12-18 DIAGNOSIS — R69 Illness, unspecified: Secondary | ICD-10-CM | POA: Diagnosis not present

## 2015-12-25 ENCOUNTER — Other Ambulatory Visit (INDEPENDENT_AMBULATORY_CARE_PROVIDER_SITE_OTHER): Payer: Medicare HMO

## 2015-12-25 DIAGNOSIS — Z125 Encounter for screening for malignant neoplasm of prostate: Secondary | ICD-10-CM | POA: Diagnosis not present

## 2015-12-25 DIAGNOSIS — Z0000 Encounter for general adult medical examination without abnormal findings: Secondary | ICD-10-CM | POA: Diagnosis not present

## 2015-12-25 LAB — POC URINALSYSI DIPSTICK (AUTOMATED)
Bilirubin, UA: NEGATIVE
GLUCOSE UA: NEGATIVE
Ketones, UA: NEGATIVE
Leukocytes, UA: NEGATIVE
Nitrite, UA: NEGATIVE
Protein, UA: NEGATIVE
RBC UA: NEGATIVE
SPEC GRAV UA: 1.02
Urobilinogen, UA: 0.2
pH, UA: 5.5

## 2015-12-25 LAB — HEPATIC FUNCTION PANEL
ALBUMIN: 4.3 g/dL (ref 3.5–5.2)
ALK PHOS: 60 U/L (ref 39–117)
ALT: 16 U/L (ref 0–53)
AST: 17 U/L (ref 0–37)
BILIRUBIN DIRECT: 0.1 mg/dL (ref 0.0–0.3)
BILIRUBIN TOTAL: 1.1 mg/dL (ref 0.2–1.2)
Total Protein: 6.9 g/dL (ref 6.0–8.3)

## 2015-12-25 LAB — CBC WITH DIFFERENTIAL/PLATELET
BASOS ABS: 0 10*3/uL (ref 0.0–0.1)
Basophils Relative: 0.5 % (ref 0.0–3.0)
EOS ABS: 0 10*3/uL (ref 0.0–0.7)
Eosinophils Relative: 0.8 % (ref 0.0–5.0)
HCT: 38 % — ABNORMAL LOW (ref 39.0–52.0)
Hemoglobin: 12.6 g/dL — ABNORMAL LOW (ref 13.0–17.0)
Lymphocytes Relative: 32.9 % (ref 12.0–46.0)
Lymphs Abs: 1.9 10*3/uL (ref 0.7–4.0)
MCHC: 33 g/dL (ref 30.0–36.0)
MCV: 82.8 fl (ref 78.0–100.0)
MONO ABS: 0.4 10*3/uL (ref 0.1–1.0)
MONOS PCT: 6 % (ref 3.0–12.0)
NEUTROS ABS: 3.5 10*3/uL (ref 1.4–7.7)
Neutrophils Relative %: 59.8 % (ref 43.0–77.0)
PLATELETS: 246 10*3/uL (ref 150.0–400.0)
RBC: 4.59 Mil/uL (ref 4.22–5.81)
RDW: 13.4 % (ref 11.5–15.5)
WBC: 5.9 10*3/uL (ref 4.0–10.5)

## 2015-12-25 LAB — TSH: TSH: 2.37 u[IU]/mL (ref 0.35–4.50)

## 2015-12-25 LAB — LIPID PANEL
Cholesterol: 228 mg/dL — ABNORMAL HIGH (ref 0–200)
HDL: 48.8 mg/dL (ref >39.00)
LDL CALC: 154 mg/dL — AB (ref 0–99)
NONHDL: 179.23
Total CHOL/HDL Ratio: 5
Triglycerides: 127 mg/dL (ref 0.0–149.0)
VLDL: 25.4 mg/dL (ref 0.0–40.0)

## 2015-12-25 LAB — BASIC METABOLIC PANEL
BUN: 15 mg/dL (ref 6–23)
CALCIUM: 9.2 mg/dL (ref 8.4–10.5)
CO2: 24 mEq/L (ref 19–32)
Chloride: 106 mEq/L (ref 96–112)
Creatinine, Ser: 1.17 mg/dL (ref 0.40–1.50)
GFR: 66.09 mL/min (ref >60.00)
GLUCOSE: 96 mg/dL (ref 70–99)
POTASSIUM: 4.4 meq/L (ref 3.5–5.1)
Sodium: 138 mEq/L (ref 135–145)

## 2015-12-25 LAB — PSA: PSA: 0.65 ng/mL (ref 0.10–4.00)

## 2015-12-27 ENCOUNTER — Telehealth: Payer: Self-pay | Admitting: Family Medicine

## 2015-12-27 DIAGNOSIS — D649 Anemia, unspecified: Secondary | ICD-10-CM

## 2015-12-27 NOTE — Telephone Encounter (Signed)
I left a voice message with below information. 

## 2015-12-27 NOTE — Telephone Encounter (Signed)
Pt requesting a repeat of labs due to diagnosis of anemia.

## 2015-12-27 NOTE — Telephone Encounter (Signed)
He can come in for more labs and we will review these on Monday

## 2016-01-01 ENCOUNTER — Encounter: Payer: Self-pay | Admitting: Family Medicine

## 2016-01-01 ENCOUNTER — Other Ambulatory Visit (INDEPENDENT_AMBULATORY_CARE_PROVIDER_SITE_OTHER): Payer: Medicare HMO

## 2016-01-01 ENCOUNTER — Ambulatory Visit (INDEPENDENT_AMBULATORY_CARE_PROVIDER_SITE_OTHER): Payer: Medicare HMO | Admitting: Family Medicine

## 2016-01-01 VITALS — BP 136/86 | HR 86 | Temp 98.9°F | Wt 177.0 lb

## 2016-01-01 DIAGNOSIS — D649 Anemia, unspecified: Secondary | ICD-10-CM

## 2016-01-01 DIAGNOSIS — Z86010 Personal history of colonic polyps: Secondary | ICD-10-CM | POA: Diagnosis not present

## 2016-01-01 DIAGNOSIS — E785 Hyperlipidemia, unspecified: Secondary | ICD-10-CM | POA: Diagnosis not present

## 2016-01-01 DIAGNOSIS — Z860101 Personal history of adenomatous and serrated colon polyps: Secondary | ICD-10-CM

## 2016-01-01 LAB — CBC WITH DIFFERENTIAL/PLATELET
BASOS ABS: 0 10*3/uL (ref 0.0–0.1)
BASOS PCT: 0.4 % (ref 0.0–3.0)
Eosinophils Absolute: 0 10*3/uL (ref 0.0–0.7)
Eosinophils Relative: 0.8 % (ref 0.0–5.0)
HEMATOCRIT: 39.6 % (ref 39.0–52.0)
Hemoglobin: 12.9 g/dL — ABNORMAL LOW (ref 13.0–17.0)
Lymphocytes Relative: 35.4 % (ref 12.0–46.0)
Lymphs Abs: 1.9 10*3/uL (ref 0.7–4.0)
MCHC: 32.7 g/dL (ref 30.0–36.0)
MCV: 83.3 fl (ref 78.0–100.0)
MONOS PCT: 9.5 % (ref 3.0–12.0)
Monocytes Absolute: 0.5 10*3/uL (ref 0.1–1.0)
NEUTROS ABS: 3 10*3/uL (ref 1.4–7.7)
Neutrophils Relative %: 53.9 % (ref 43.0–77.0)
PLATELETS: 209 10*3/uL (ref 150.0–400.0)
RBC: 4.75 Mil/uL (ref 4.22–5.81)
RDW: 14.1 % (ref 11.5–15.5)
WBC: 5.5 10*3/uL (ref 4.0–10.5)

## 2016-01-01 LAB — FOLATE

## 2016-01-01 LAB — VITAMIN B12: Vitamin B-12: 652 pg/mL (ref 211–911)

## 2016-01-01 LAB — FERRITIN: Ferritin: 9 ng/mL — ABNORMAL LOW (ref 22.0–322.0)

## 2016-01-01 LAB — IRON: Iron: 44 ug/dL (ref 42–165)

## 2016-01-01 NOTE — Progress Notes (Signed)
Pre visit review using our clinic review tool, if applicable. No additional management support is needed unless otherwise documented below in the visit note. 

## 2016-01-01 NOTE — Progress Notes (Signed)
   Subjective:    Patient ID: Christian Martin, male    DOB: 1951-01-17, 65 y.o.   MRN: [id number]  HPI 65 yr old male to follow up on elevated lipids and a new diagnosis of anemia. He admits to feeling a little more tired than usual the past few months. No recent change in diet. He has been a vegetarian for years but does consume eggs and dairy products. He works out at a gym 2-3 days a week.    Review of Systems  Constitutional: Negative.   HENT: Negative.   Eyes: Negative.   Respiratory: Negative.   Cardiovascular: Negative.   Gastrointestinal: Negative.   Genitourinary: Negative.   Musculoskeletal: Negative.   Skin: Negative.   Neurological: Negative.   Psychiatric/Behavioral: Negative.        Objective:   Physical Exam  Constitutional: He is oriented to person, place, and time. He appears well-developed and well-nourished. No distress.  HENT:  Head: Normocephalic and atraumatic.  Right Ear: External ear normal.  Left Ear: External ear normal.  Nose: Nose normal.  Mouth/Throat: Oropharynx is clear and moist. No oropharyngeal exudate.  Eyes: Conjunctivae and EOM are normal. Pupils are equal, round, and reactive to light. Right eye exhibits no discharge. Left eye exhibits no discharge. No scleral icterus.  Neck: Neck supple. No JVD present. No tracheal deviation present. No thyromegaly present.  Cardiovascular: Normal rate, regular rhythm, normal heart sounds and intact distal pulses.  Exam reveals no gallop and no friction rub.   No murmur heard. EKG normal   Pulmonary/Chest: Effort normal and breath sounds normal. No respiratory distress. He has no wheezes. He has no rales. He exhibits no tenderness.  Abdominal: Soft. Bowel sounds are normal. He exhibits no distension and no mass. There is no tenderness. There is no rebound and no guarding.  Genitourinary: Rectum normal, prostate normal and penis normal. Guaiac negative stool. No penile tenderness.  Musculoskeletal: Normal  range of motion. He exhibits no edema or tenderness.  Lymphadenopathy:    He has no cervical adenopathy.  Neurological: He is alert and oriented to person, place, and time. He has normal reflexes. No cranial nerve deficit. He exhibits normal muscle tone. Coordination normal.  Skin: Skin is warm and dry. No rash noted. He is not diaphoretic. No erythema. No pallor.  Psychiatric: He has a normal mood and affect. His behavior is normal. Judgment and thought content normal.          Assessment & Plan:  His dyslipidemia is stable. He will continue to follow a low fat diet. We decided to not prescribe a statin medication at this time. He has mild anemia, and he has labs pending from this morning to work this up. His stool is hemocult negative on exam today.  Laurey Morale, MD

## 2016-01-02 ENCOUNTER — Encounter: Payer: Self-pay | Admitting: Family Medicine

## 2016-01-04 ENCOUNTER — Telehealth: Payer: Self-pay

## 2016-01-04 ENCOUNTER — Encounter: Payer: Self-pay | Admitting: Family Medicine

## 2016-01-04 MED ORDER — FERROUS SULFATE 324 (65 FE) MG PO TBEC: 1.0000 | DELAYED_RELEASE_TABLET | Freq: Two times a day (BID) | ORAL | Status: DC

## 2016-01-04 NOTE — Telephone Encounter (Signed)
Left message for patient to call office regarding low iron results.  Ferrous sulfate was sent to pharmacy.

## 2016-01-04 NOTE — Telephone Encounter (Signed)
-----   Message from Laurey Morale, MD sent at 01/04/2016 10:06 AM EDT ----- His anemia is from low iron. Call in ferrous sulfate 324 mg to take bid, #60 with 11 rf. Recheck a CBC in 90 days

## 2016-01-05 NOTE — Telephone Encounter (Signed)
Based on last lab results, it was recommended twice a day.

## 2016-01-05 NOTE — Telephone Encounter (Signed)
I would stick with twice a day

## 2016-04-05 ENCOUNTER — Other Ambulatory Visit (INDEPENDENT_AMBULATORY_CARE_PROVIDER_SITE_OTHER): Payer: Medicare HMO

## 2016-04-05 DIAGNOSIS — D539 Nutritional anemia, unspecified: Secondary | ICD-10-CM | POA: Diagnosis not present

## 2016-04-05 LAB — CBC WITH DIFFERENTIAL/PLATELET
BASOS ABS: 0 10*3/uL (ref 0.0–0.1)
Basophils Relative: 0.4 % (ref 0.0–3.0)
EOS ABS: 0 10*3/uL (ref 0.0–0.7)
Eosinophils Relative: 0.7 % (ref 0.0–5.0)
HCT: 42.6 % (ref 39.0–52.0)
HEMOGLOBIN: 14.4 g/dL (ref 13.0–17.0)
Lymphocytes Relative: 31.5 % (ref 12.0–46.0)
Lymphs Abs: 1.9 10*3/uL (ref 0.7–4.0)
MCHC: 33.7 g/dL (ref 30.0–36.0)
MCV: 89.3 fl (ref 78.0–100.0)
MONO ABS: 0.4 10*3/uL (ref 0.1–1.0)
Monocytes Relative: 6.7 % (ref 3.0–12.0)
Neutro Abs: 3.6 10*3/uL (ref 1.4–7.7)
Neutrophils Relative %: 60.7 % (ref 43.0–77.0)
Platelets: 188 10*3/uL (ref 150.0–400.0)
RBC: 4.77 Mil/uL (ref 4.22–5.81)
RDW: 15.4 % (ref 11.5–15.5)
WBC: 5.9 10*3/uL (ref 4.0–10.5)

## 2016-04-06 ENCOUNTER — Encounter: Payer: Self-pay | Admitting: Family Medicine

## 2016-04-08 ENCOUNTER — Encounter: Payer: Self-pay | Admitting: Family Medicine

## 2016-04-08 DIAGNOSIS — D509 Iron deficiency anemia, unspecified: Secondary | ICD-10-CM

## 2016-04-12 NOTE — Telephone Encounter (Signed)
I put in the order for a ferritin so he can schedule a lab draw for this. As for the hepatitis C test, I have not received any records from the other office. The patient needs to call that office and ask them if he got this test.

## 2016-04-17 NOTE — Telephone Encounter (Signed)
The ferritin test is NOT necessary because he is not longer anemic. I simply order it because he WANTED me to. Now he has apparently changed his mind. Please cancel the order. As for the hepatitis vaccine, I spent 20 MINUTES of my time going through every page of the records they sent over and I could not find it. It would be a simple matter for that office to find this information.

## 2016-05-06 ENCOUNTER — Ambulatory Visit (INDEPENDENT_AMBULATORY_CARE_PROVIDER_SITE_OTHER): Payer: Medicare HMO | Admitting: Family Medicine

## 2016-05-06 DIAGNOSIS — Z23 Encounter for immunization: Secondary | ICD-10-CM

## 2016-06-20 NOTE — Progress Notes (Addendum)
Subjective:   Christian Martin is a 65 y.o. male who presents for Medicare Annual/Subsequent preventive examination.  [phone]) The Patient was informed that the wellness visit is to identify future health risk and educate and initiate measures that can reduce risk for increased disease through the lifespan.    NO ROS; Medicare Wellness Visit  Describes health as good, fair or great? Great   Preventive Screening -Counseling & Management   Current smoking/ tobacco status/never smoked  ETOH: daily glass of wine   Still works 3 days a week and 2 days [facility] when [employer] work which he loves    RISK FACTORS Regular exercise every week; up and down at job  Goes to the y x 2 per week 1.5 hours of aerobic;  1/2 elliptical; 10 min weight lifting; 15 minutes of swimming laps;    Diet: BMI is good Vegetarian; 3 meals a day; eats eggs and fish; Vegetables;  Likes pasta and cheese   Fall risk none noted  Mobility of Functional changes this year?  States mobility 100% better; due to knee pain being resolved;  Exercise helped equalize strength in left and right Needs core strength work; educated regarding core exercises Will consider yoga   Safety; community, wears sunscreen, safe place for firearms; Motor vehicle accidents; none  Cardiac Risk Factors:  Advanced aged > 42 in men Hyperlipidemia; cho 228; trig 127; HDL 48 and LDL 154;  Did try statins; decided to stop these Takes supplements;  Family History (mother had osteoporosis; htn; father had HF he was a smoker ; sister had cancer - breast)   Depression Screen Occasionally gets blue, but not depression he had earlier in his life;  This is not debilitating Aware of triggers   Activities of Daily Living - See functional screen   Hearing Difficulty: 4000 hz both ears   Ophthalmology Exam: hx of retinal tear 2016; Dr. Lind Guest of light occasionally; not constant  With left eye; "arch shape and  flashes" See eye doctor when  he needs a rx;  Floaters better Will outreach Dr. Sarajane Jews for recommendation  Cognitive testing; Ad8 score; 0 or less than 2  MMSE deferred or completed if AD8 + 2 issues  Advanced Directives educated Will try to complete AD; Given copy  Referred to [facility] for questions [place] offers free advance directive forms, as well as assistance in completing the forms themselves. For assistance, contact the Spiritual Care Department at [phone], or the Clinical Social Work Department at [phone].   List the name of Physicians or other Practitioners you currently use:   Immunization History  Administered Date(s) Administered  . Influenza,inj,Quad PF,36+ Mos 04/21/2015, 05/06/2016  . Pneumococcal Conjugate-13 01/10/2014  . Pneumococcal Polysaccharide-23 04/21/2015  . Tdap 05/06/2016  . Zoster 01/08/2013   Required Immunizations needed today Stated he had tdap and was charged; Educated regarding medicare benefit   Screening test up to date or reviewed for plan of completion Health Maintenance Due  Topic Date Due  . Hepatitis C Screening  Apr 05, 1951   Medicare now request all "baby boomers" test for possible exposure to Hepatitis C. Many may have been exposed due to dental work, tatoo's, vaccinations when young. The Hepatitis C virus is dormant for many years and then sometimes will cause liver cancer. Agrees to draw Hepatitis C   Colonoscopy: 09/2009 due 09/2019 Last colonoscopy states due in 7years  09/2016 Will try to change in the system  Medical Team  updated   Cardiac Risk Factors include: advanced age (>25men, >82 women);dyslipidemia;family history of premature cardiovascular disease     Objective:    Vitals: BP 140/80   Pulse 61   Ht 5\' 11"  (1.803 m)   Wt 179 lb 2 oz (81.3 kg)   SpO2 96%   BMI 24.98 kg/m   Body mass index is 24.98 kg/m.  Tobacco History  Smoking Status  . Never Smoker  Smokeless Tobacco  . Never Used       Counseling given: Yes   Past Medical History:  Diagnosis Date  . Hx of adenomatous colonic polyps   . Hyperlipidemia   . Retinal tear of right eye 2016   saw Dr. Prudencio Burly    Past Surgical History:  Procedure Laterality Date  . COLONOSCOPY  10-03-09   per Dr. Deatra Ina, clear, repeat in 7 yrs   . WISDOM TOOTH EXTRACTION     Family History  Problem Relation Age of Onset  . Cancer Sister     breast  . Osteoporosis Mother   . Hypertension Mother   . Heart failure Father   . Breast cancer    . Coronary artery disease     History  Sexual Activity  . Sexual activity: Not on file    Outpatient Encounter Prescriptions as of 06/21/2016  Medication Sig  . ALFALFA PO Take by mouth daily. 2.4 grams  . aspirin 81 MG tablet Take 81 mg by mouth daily.    . Cholecalciferol (VITAMIN D PO) Take by mouth. 200 IV  . Coenzyme Q10 (CO Q 10 PO) Take by mouth daily.  . ferrous sulfate 324 (65 Fe) MG TBEC Take 1 tablet (325 mg total) by mouth 2 (two) times daily.  . fish oil-omega-3 fatty acids 1000 MG capsule Take 2 g by mouth daily.    . Garlic 123XX123 MG CAPS Take by mouth.    . Multiple Vitamin (MULTIVITAMIN) tablet Take 1 tablet by mouth daily.    . Probiotic Product (PROBIOTIC PO) Take by mouth daily.  Marland Kitchen ROYAL JELLY PO Take 650 mg by mouth daily.   No facility-administered encounter medications on file as of 06/21/2016.     Activities of Daily Living In your present state of health, do you have any difficulty performing the following activities: 06/21/2016  Hearing? N  Vision? N  Difficulty concentrating or making decisions? N  Walking or climbing stairs? N  Dressing or bathing? N  Doing errands, shopping? N  Preparing Food and eating ? N  Using the Toilet? N  In the past six months, have you accidently leaked urine? N  Do you have problems with loss of bowel control? N  Managing your Medications? N  Managing your Finances? N  Housekeeping or managing your Housekeeping? N  Some  recent data might be hidden    Patient Care Team: Laurey Morale, MD as PCP - General   Assessment:    Discussed Recommended screenings and documented any personalized health advice and referrals for preventive counseling.  See AVS for patient instructions;    Exercise Activities and Dietary recommendations Current Exercise Habits: Structured exercise class, Type of exercise: walking;stretching;strength training/weights, Time (Minutes): > 60, Frequency (Times/Week): 2, Weekly Exercise (Minutes/Week): 0, Intensity: Moderate  Goals    . Exercise 150 minutes per week (moderate activity)          Try core work; yoga or other     . patient  Continue to work on brain function and work word puzzles         Fall Risk Fall Risk  06/21/2016 01/12/2015  Falls in the past year? No No   Depression Screen PHQ 2/9 Scores 06/21/2016 01/12/2015  PHQ - 2 Score 0 0    Cognitive Function MMSE - Mini Mental State Exam 06/21/2016  Not completed: (No Data)    Ad8 score is 0     Immunization History  Administered Date(s) Administered  . Influenza,inj,Quad PF,36+ Mos 04/21/2015, 05/06/2016  . Pneumococcal Conjugate-13 01/10/2014  . Pneumococcal Polysaccharide-23 04/21/2015  . Tdap 05/06/2016  . Zoster 01/08/2013   Screening Tests Health Maintenance  Topic Date Due  . Hepatitis C Screening  1951/05/03  . COLONOSCOPY  10/04/2019  . TETANUS/TDAP  05/06/2026  . INFLUENZA VACCINE  Completed  . ZOSTAVAX  Completed  . PNA vac Low Risk Adult  Completed      Plan:     1. Plans to get Hep c screen the next time his blood is drawn;  2. Enc to make eye apt for vision check; had burst of floaters (resolved) and was seen but would like to see someone else next time. Will outreach Dr. Sarajane Jews for recommendation. Now has "arc of lights" in left eye; this has been recent. Recommended he fup for eye exam  The patient will outreach Dr. Sarajane Jews for recommendation   3. Took copy of HCPOA and  will review for completion  4. Colonoscopy ordered for 10 years, 2021;  Report states 7 years; due 09/2016 Will outreach Dr. Sarajane Jews for different GI specialist  5 educated on adding core exercises to his routine  During the course of the visit the patient was educated and counseled about the following appropriate screening and preventive services:   Vaccines to include Pneumoccal, Influenza, Hepatitis B, Td, Zostavax, HCV  Electrocardiogram  Cardiovascular Disease none  Colorectal cancer screening 09/2016  Diabetes screening neg  Prostate Cancer Screening deferred   Glaucoma screening not at last check   Nutrition counseling / educated on adding foods with iron; which is limited with vegan choices  Smoking cessation counseling/n/a   Patient Instructions (the written plan) was given to the patient.    [id number], RN  06/21/2016  I have reviewed this note and I agree with its contents.  Laurey Morale, MD

## 2016-06-21 ENCOUNTER — Ambulatory Visit (INDEPENDENT_AMBULATORY_CARE_PROVIDER_SITE_OTHER): Payer: Medicare HMO

## 2016-06-21 VITALS — BP 140/80 | HR 61 | Ht 71.0 in | Wt 179.1 lb

## 2016-06-21 DIAGNOSIS — Z0000 Encounter for general adult medical examination without abnormal findings: Secondary | ICD-10-CM

## 2016-06-21 DIAGNOSIS — Z7289 Other problems related to lifestyle: Secondary | ICD-10-CM | POA: Diagnosis not present

## 2016-06-21 NOTE — Patient Instructions (Addendum)
Christian Martin , Thank you for taking time to come for your Medicare Wellness Visit. I appreciate your ongoing commitment to your health goals. Please review the following plan we discussed and let me know if I can assist you in the future.   Will consider eye exam with trusted practitioner  Will try to complete AD; Given copy  Referred to [facility] for questions [facility] offers free advance directive forms, as well as assistance in completing the forms themselves. For assistance, contact the Spiritual Care Department at [phone], or the Clinical Social Work Department at [phone].  Will order hep C for next blood draw;    These are the goals we discussed: Goals    . patient          Continue to work on brain function and work word puzzles          This is a list of the screening recommended for you and due dates:  Health Maintenance  Topic Date Due  .  Hepatitis C: One time screening is recommended by Center for Disease Control  (CDC) for  adults born from [birth year] through 1967.   1951-08-26  . Colon Cancer Screening  10/04/2019  . Tetanus Vaccine  05/06/2026  . Flu Shot  Completed  . Shingles Vaccine  Completed  . Pneumonia vaccines  Completed             Fall Prevention in the Home  Falls can cause injuries. They can happen to people of all ages. There are many things you can do to make your home safe and to help prevent falls.  WHAT CAN I DO ON THE OUTSIDE OF MY HOME?  Regularly fix the edges of walkways and driveways and fix any cracks.  Remove anything that might make you trip as you walk through a door, such as a raised step or threshold.  Trim any bushes or trees on the path to your home.  Use bright outdoor lighting.  Clear any walking paths of anything that might make someone trip, such as rocks or tools.  Regularly check to see if handrails are loose or broken. Make sure that both sides of any steps have handrails.  Any raised decks and  porches should have guardrails on the edges.  Have any leaves, snow, or ice cleared regularly.  Use sand or salt on walking paths during winter.  Clean up any spills in your garage right away. This includes oil or grease spills. WHAT CAN I DO IN THE BATHROOM?   Use night lights.  Install grab bars by the toilet and in the tub and shower. Do not use towel bars as grab bars.  Use non-skid mats or decals in the tub or shower.  If you need to sit down in the shower, use a plastic, non-slip stool.  Keep the floor dry. Clean up any water that spills on the floor as soon as it happens.  Remove soap buildup in the tub or shower regularly.  Attach bath mats securely with double-sided non-slip rug tape.  Do not have throw rugs and other things on the floor that can make you trip. WHAT CAN I DO IN THE BEDROOM?  Use night lights.  Make sure that you have a light by your bed that is easy to reach.  Do not use any sheets or blankets that are too big for your bed. They should not hang down onto the floor.  Have a firm  chair that has side arms. You can use this for support while you get dressed.  Do not have throw rugs and other things on the floor that can make you trip. WHAT CAN I DO IN THE KITCHEN?  Clean up any spills right away.  Avoid walking on wet floors.  Keep items that you use a lot in easy-to-reach places.  If you need to reach something above you, use a strong step stool that has a grab bar.  Keep electrical cords out of the way.  Do not use floor polish or wax that makes floors slippery. If you must use wax, use non-skid floor wax.  Do not have throw rugs and other things on the floor that can make you trip. WHAT CAN I DO WITH MY STAIRS?  Do not leave any items on the stairs.  Make sure that there are handrails on both sides of the stairs and use them. Fix handrails that are broken or loose. Make sure that handrails are as long as the stairways.  Check any  carpeting to make sure that it is firmly attached to the stairs. Fix any carpet that is loose or worn.  Avoid having throw rugs at the top or bottom of the stairs. If you do have throw rugs, attach them to the floor with carpet tape.  Make sure that you have a light switch at the top of the stairs and the bottom of the stairs. If you do not have them, ask someone to add them for you. WHAT ELSE CAN I DO TO HELP PREVENT FALLS?  Wear shoes that:  Do not have high heels.  Have rubber bottoms.  Are comfortable and fit you well.  Are closed at the toe. Do not wear sandals.  If you use a stepladder:  Make sure that it is fully opened. Do not climb a closed stepladder.  Make sure that both sides of the stepladder are locked into place.  Ask someone to hold it for you, if possible.  Clearly mark and make sure that you can see:  Any grab bars or handrails.  First and last steps.  Where the edge of each step is.  Use tools that help you move around (mobility aids) if they are needed. These include:  Canes.  Walkers.  Scooters.  Crutches.  Turn on the lights when you go into a dark area. Replace any light bulbs as soon as they burn out.  Set up your furniture so you have a clear path. Avoid moving your furniture around.  If any of your floors are uneven, fix them.  If there are any pets around you, be aware of where they are.  Review your medicines with your doctor. Some medicines can make you feel dizzy. This can increase your chance of falling. Ask your doctor what other things that you can do to help prevent falls.   This information is not intended to replace advice given to you by your health care provider. Make sure you discuss any questions you have with your health care provider.   Document Released: 06/08/2009 Document Revised: 12/27/2014 Document Reviewed: 09/16/2014 Elsevier Interactive Patient Education 2016 [place] Maintenance, Male A  healthy lifestyle and preventative care can promote health and wellness.  Maintain regular health, dental, and eye exams.  Eat a healthy diet. Foods like vegetables, fruits, whole grains, low-fat dairy products, and lean protein foods contain the nutrients you need and are low in calories. Decrease your intake of  foods high in solid fats, added sugars, and salt. Get information about a proper diet from your health care provider, if necessary.  Regular physical exercise is one of the most important things you can do for your health. Most adults should get at least 150 minutes of moderate-intensity exercise (any activity that increases your heart rate and causes you to sweat) each week. In addition, most adults need muscle-strengthening exercises on 2 or more days a week.   Maintain a healthy weight. The body mass index (BMI) is a screening tool to identify possible weight problems. It provides an estimate of body fat based on height and weight. Your health care provider can find your BMI and can help you achieve or maintain a healthy weight. For males 20 years and older:  A BMI below 18.5 is considered underweight.  A BMI of 18.5 to 24.9 is normal.  A BMI of 25 to 29.9 is considered overweight.  A BMI of 30 and above is considered obese.  Maintain normal blood lipids and cholesterol by exercising and minimizing your intake of saturated fat. Eat a balanced diet with plenty of fruits and vegetables. Blood tests for lipids and cholesterol should begin at age 39 and be repeated every 5 years. If your lipid or cholesterol levels are high, you are over age 60, or you are at high risk for heart disease, you may need your cholesterol levels checked more frequently.Ongoing high lipid and cholesterol levels should be treated with medicines if diet and exercise are not working.  If you smoke, find out from your health care provider how to quit. If you do not use tobacco, do not start.  Lung cancer  screening is recommended for adults aged 32-80 years who are at high risk for developing lung cancer because of a history of smoking. A yearly low-dose CT scan of the lungs is recommended for people who have at least a 30-pack-year history of smoking and are current smokers or have quit within the past 15 years. A pack year of smoking is smoking an average of 1 pack of cigarettes a day for 1 year (for example, a 30-pack-year history of smoking could mean smoking 1 pack a day for 30 years or 2 packs a day for 15 years). Yearly screening should continue until the smoker has stopped smoking for at least 15 years. Yearly screening should be stopped for people who develop a health problem that would prevent them from having lung cancer treatment.  If you choose to drink alcohol, do not have more than 2 drinks per day. One drink is considered to be 12 oz (360 mL) of beer, 5 oz (150 mL) of wine, or 1.5 oz (45 mL) of liquor.  Avoid the use of street drugs. Do not share needles with anyone. Ask for help if you need support or instructions about stopping the use of drugs.  High blood pressure causes heart disease and increases the risk of stroke. High blood pressure is more likely to develop in:  People who have blood pressure in the end of the normal range (100-139/85-89 mm Hg).  People who are overweight or obese.  People who are African American.  If you are 66-77 years of age, have your blood pressure checked every 3-5 years. If you are 70 years of age or older, have your blood pressure checked every year. You should have your blood pressure measured twice--once when you are at a hospital or clinic, and once when you are not at  a hospital or clinic. Record the average of the two measurements. To check your blood pressure when you are not at a hospital or clinic, you can use:  An automated blood pressure machine at a pharmacy.  A home blood pressure monitor.  If you are 33-5 years old, ask your health  care provider if you should take aspirin to prevent heart disease.  Diabetes screening involves taking a blood sample to check your fasting blood sugar level. This should be done once every 3 years after age 79 if you are at a normal weight and without risk factors for diabetes. Testing should be considered at a younger age or be carried out more frequently if you are overweight and have at least 1 risk factor for diabetes.  Colorectal cancer can be detected and often prevented. Most routine colorectal cancer screening begins at the age of 7 and continues through age 58. However, your health care provider may recommend screening at an earlier age if you have risk factors for colon cancer. On a yearly basis, your health care provider may provide home test kits to check for hidden blood in the stool. A small camera at the end of a tube may be used to directly examine the colon (sigmoidoscopy or colonoscopy) to detect the earliest forms of colorectal cancer. Talk to your health care provider about this at age 40 when routine screening begins. A direct exam of the colon should be repeated every 5-10 years through age 8, unless early forms of precancerous polyps or small growths are found.  People who are at an increased risk for hepatitis B should be screened for this virus. You are considered at high risk for hepatitis B if:  You were born in a country where hepatitis B occurs often. Talk with your health care provider about which countries are considered high risk.  Your parents were born in a high-risk country and you have not received a shot to protect against hepatitis B (hepatitis B vaccine).  You have HIV or AIDS.  You use needles to inject street drugs.  You live with, or have sex with, someone who has hepatitis B.  You are a man who has sex with other men (MSM).  You get hemodialysis treatment.  You take certain medicines for conditions like cancer, organ transplantation, and autoimmune  conditions.  Hepatitis C blood testing is recommended for all people born from [birth year] through 1965 and any individual with known risk factors for hepatitis C.  Healthy men should no longer receive prostate-specific antigen (PSA) blood tests as part of routine cancer screening. Talk to your health care provider about prostate cancer screening.  Testicular cancer screening is not recommended for adolescents or adult males who have no symptoms. Screening includes self-exam, a health care provider exam, and other screening tests. Consult with your health care provider about any symptoms you have or any concerns you have about testicular cancer.  Practice safe sex. Use condoms and avoid high-risk sexual practices to reduce the spread of sexually transmitted infections (STIs).  You should be screened for STIs, including gonorrhea and chlamydia if:  You are sexually active and are younger than 24 years.  You are older than 24 years, and your health care provider tells you that you are at risk for this type of infection.  Your sexual activity has changed since you were last screened, and you are at an increased risk for chlamydia or gonorrhea. Ask your health care provider if you are at risk.  If you are at risk of being infected with HIV, it is recommended that you take a prescription medicine daily to prevent HIV infection. This is called pre-exposure prophylaxis (PrEP). You are considered at risk if:  You are a man who has sex with other men (MSM).  You are a heterosexual man who is sexually active with multiple partners.  You take drugs by injection.  You are sexually active with a partner who has HIV.  Talk with your health care provider about whether you are at high risk of being infected with HIV. If you choose to begin PrEP, you should first be tested for HIV. You should then be tested every 3 months for as long as you are taking PrEP.  Use sunscreen. Apply sunscreen liberally and  repeatedly throughout the day. You should seek shade when your shadow is shorter than you. Protect yourself by wearing long sleeves, pants, a wide-brimmed hat, and sunglasses year round whenever you are outdoors.  Tell your health care provider of new moles or changes in moles, especially if there is a change in shape or color. Also, tell your health care provider if a mole is larger than the size of a pencil eraser.  A one-time screening for abdominal aortic aneurysm (AAA) and surgical repair of large AAAs by ultrasound is recommended for men aged 76-75 years who are current or former smokers.  Stay current with your vaccines (immunizations).   This information is not intended to replace advice given to you by your health care provider. Make sure you discuss any questions you have with your health care provider.   Document Released: 02/08/2008 Document Revised: 09/02/2014 Document Reviewed: 01/07/2011 Elsevier Interactive Patient Education [employer].

## 2016-09-09 ENCOUNTER — Encounter: Payer: Self-pay | Admitting: Gastroenterology

## 2016-12-16 DIAGNOSIS — R69 Illness, unspecified: Secondary | ICD-10-CM | POA: Diagnosis not present

## 2016-12-27 ENCOUNTER — Other Ambulatory Visit: Payer: Medicare HMO

## 2017-01-03 ENCOUNTER — Ambulatory Visit (INDEPENDENT_AMBULATORY_CARE_PROVIDER_SITE_OTHER): Payer: Medicare HMO | Admitting: Family Medicine

## 2017-01-03 ENCOUNTER — Encounter: Payer: Self-pay | Admitting: Family Medicine

## 2017-01-03 VITALS — BP 134/88 | HR 66 | Temp 98.5°F | Ht 71.0 in | Wt 161.0 lb

## 2017-01-03 DIAGNOSIS — E785 Hyperlipidemia, unspecified: Secondary | ICD-10-CM | POA: Diagnosis not present

## 2017-01-03 DIAGNOSIS — N138 Other obstructive and reflux uropathy: Secondary | ICD-10-CM

## 2017-01-03 DIAGNOSIS — L820 Inflamed seborrheic keratosis: Secondary | ICD-10-CM | POA: Diagnosis not present

## 2017-01-03 DIAGNOSIS — F418 Other specified anxiety disorders: Secondary | ICD-10-CM | POA: Diagnosis not present

## 2017-01-03 DIAGNOSIS — Z209 Contact with and (suspected) exposure to unspecified communicable disease: Secondary | ICD-10-CM | POA: Diagnosis not present

## 2017-01-03 DIAGNOSIS — R69 Illness, unspecified: Secondary | ICD-10-CM | POA: Diagnosis not present

## 2017-01-03 DIAGNOSIS — N401 Enlarged prostate with lower urinary tract symptoms: Secondary | ICD-10-CM

## 2017-01-03 LAB — POC URINALSYSI DIPSTICK (AUTOMATED)
Bilirubin, UA: NEGATIVE
GLUCOSE UA: NEGATIVE
Ketones, UA: NEGATIVE
Leukocytes, UA: NEGATIVE
NITRITE UA: NEGATIVE
PROTEIN UA: NEGATIVE
RBC UA: NEGATIVE
SPEC GRAV UA: 1.015 (ref 1.010–1.025)
UROBILINOGEN UA: 0.2 U/dL
pH, UA: 6 (ref 5.0–8.0)

## 2017-01-03 LAB — BASIC METABOLIC PANEL
BUN: 17 mg/dL (ref 6–23)
CO2: 31 mEq/L (ref 19–32)
Calcium: 9.6 mg/dL (ref 8.4–10.5)
Chloride: 102 mEq/L (ref 96–112)
Creatinine, Ser: 1.16 mg/dL (ref 0.40–1.50)
GFR: 66.54 mL/min (ref >60.00)
GLUCOSE: 89 mg/dL (ref 70–99)
POTASSIUM: 4.5 meq/L (ref 3.5–5.1)
Sodium: 139 mEq/L (ref 135–145)

## 2017-01-03 LAB — LIPID PANEL
CHOL/HDL RATIO: 4
Cholesterol: 207 mg/dL — ABNORMAL HIGH (ref 0–200)
HDL: 51.1 mg/dL (ref >39.00)
LDL CALC: 135 mg/dL — AB (ref 0–99)
NonHDL: 156.16
Triglycerides: 107 mg/dL (ref 0.0–149.0)
VLDL: 21.4 mg/dL (ref 0.0–40.0)

## 2017-01-03 LAB — CBC WITH DIFFERENTIAL/PLATELET
BASOS PCT: 0.8 % (ref 0.0–3.0)
Basophils Absolute: 0 10*3/uL (ref 0.0–0.1)
Eosinophils Absolute: 0 10*3/uL (ref 0.0–0.7)
Eosinophils Relative: 0.7 % (ref 0.0–5.0)
HCT: 44.3 % (ref 39.0–52.0)
HEMOGLOBIN: 14.8 g/dL (ref 13.0–17.0)
LYMPHS ABS: 1.6 10*3/uL (ref 0.7–4.0)
Lymphocytes Relative: 34 % (ref 12.0–46.0)
MCHC: 33.4 g/dL (ref 30.0–36.0)
MCV: 94.1 fl (ref 78.0–100.0)
MONO ABS: 0.4 10*3/uL (ref 0.1–1.0)
Monocytes Relative: 7.8 % (ref 3.0–12.0)
NEUTROS PCT: 56.7 % (ref 43.0–77.0)
Neutro Abs: 2.7 10*3/uL (ref 1.4–7.7)
PLATELETS: 201 10*3/uL (ref 150.0–400.0)
RBC: 4.71 Mil/uL (ref 4.22–5.81)
RDW: 12.7 % (ref 11.5–15.5)
WBC: 4.8 10*3/uL (ref 4.0–10.5)

## 2017-01-03 LAB — HEPATIC FUNCTION PANEL
ALK PHOS: 56 U/L (ref 39–117)
ALT: 16 U/L (ref 0–53)
AST: 18 U/L (ref 0–37)
Albumin: 4.8 g/dL (ref 3.5–5.2)
Bilirubin, Direct: 0.3 mg/dL (ref 0.0–0.3)
TOTAL PROTEIN: 7.1 g/dL (ref 6.0–8.3)
Total Bilirubin: 2 mg/dL — ABNORMAL HIGH (ref 0.2–1.2)

## 2017-01-03 LAB — TSH: TSH: 2.57 u[IU]/mL (ref 0.35–4.50)

## 2017-01-03 LAB — PSA: PSA: 0.86 ng/mL (ref 0.10–4.00)

## 2017-01-03 MED ORDER — DIAZEPAM 5 MG PO TABS: 5.0000 mg | ORAL_TABLET | Freq: Three times a day (TID) | ORAL | 0 refills | Status: DC | PRN

## 2017-01-03 NOTE — Progress Notes (Signed)
   Subjective:    Patient ID: Christian Martin, male    DOB: [birth year], 66 y.o.   MRN: [id number]  HPI 66 yr old male to follow up on issues. He feels well in general. He works out at [employer] 2 days a week. He watches his diet. He asks me to check a small red spot that appeared on the top of his head about 6 months ago. It does not bother him and it never changes. He also asks for a small supply of Valium to keep at home. Being single he is very attached to his pets, and his dog is elderly and has some health problems. Shanta thinks he will be very upset when the dog passes.    Review of Systems  Constitutional: Negative.   HENT: Negative.   Eyes: Negative.   Respiratory: Negative.   Cardiovascular: Negative.   Gastrointestinal: Negative.   Genitourinary: Negative.   Musculoskeletal: Negative.   Skin: Negative.   Neurological: Negative.   Psychiatric/Behavioral: Negative.        Objective:   Physical Exam  Constitutional: He is oriented to person, place, and time. He appears well-developed and well-nourished. No distress.  HENT:  Head: Normocephalic and atraumatic.  Right Ear: External ear normal.  Left Ear: External ear normal.  Nose: Nose normal.  Mouth/Throat: Oropharynx is clear and moist. No oropharyngeal exudate.  Eyes: Conjunctivae and EOM are normal. Pupils are equal, round, and reactive to light. Right eye exhibits no discharge. Left eye exhibits no discharge. No scleral icterus.  Neck: Neck supple. No JVD present. No tracheal deviation present. No thyromegaly present.  Cardiovascular: Normal rate, regular rhythm, normal heart sounds and intact distal pulses.  Exam reveals no gallop and no friction rub.   No murmur heard. Pulmonary/Chest: Effort normal and breath sounds normal. No respiratory distress. He has no wheezes. He has no rales. He exhibits no tenderness.  Abdominal: Soft. Bowel sounds are normal. He exhibits no distension and no mass. There is no tenderness.  There is no rebound and no guarding.  Genitourinary: Rectum normal, prostate normal and penis normal. Rectal exam shows guaiac negative stool. No penile tenderness.  Musculoskeletal: Normal range of motion. He exhibits no edema or tenderness.  Lymphadenopathy:    He has no cervical adenopathy.  Neurological: He is alert and oriented to person, place, and time. He has normal reflexes. No cranial nerve deficit. He exhibits normal muscle tone. Coordination normal.  Skin: Skin is warm and dry. No rash noted. He is not diaphoretic. No erythema. No pallor.  The top of his scalp has a 3 mm pink well defined nontender raised lesion with a velvety surface  Psychiatric: He has a normal mood and affect. His behavior is normal. Judgment and thought content normal.          Assessment & Plan:  He seems to be doing well in general. We will get fasting labs today to check his hyperlipidemia. He has what appears to be an inflamed seborrheic keratosis on the scalp. He will use sun block and wear hats when outside. E will let us know if this changes in appearance. For anxiety he will try Valium 5 mg prn.  Alysia Penna, MD

## 2017-01-03 NOTE — Patient Instructions (Signed)
WE NOW OFFER   [facility] FAST TRACK!!!  SAME DAY Appointments for ACUTE CARE  Such as: Sprains, Injuries, cuts, abrasions, rashes, muscle pain, joint pain, back pain Colds, flu, sore throats, headache, allergies, cough, fever  Ear pain, sinus and eye infections Abdominal pain, nausea, vomiting, diarrhea, upset stomach Animal/insect bites  3 Easy Ways to Schedule: Walk-In Scheduling Call in scheduling Mychart Sign-up: [email or web address]         

## 2017-01-04 LAB — HEPATITIS C ANTIBODY: HCV Ab: NEGATIVE

## 2017-05-15 ENCOUNTER — Encounter: Payer: Self-pay | Admitting: Family Medicine

## 2017-05-31 DIAGNOSIS — R69 Illness, unspecified: Secondary | ICD-10-CM | POA: Diagnosis not present

## 2017-12-22 DIAGNOSIS — R69 Illness, unspecified: Secondary | ICD-10-CM | POA: Diagnosis not present

## 2018-01-05 ENCOUNTER — Ambulatory Visit (INDEPENDENT_AMBULATORY_CARE_PROVIDER_SITE_OTHER): Payer: Medicare HMO | Admitting: Family Medicine

## 2018-01-05 ENCOUNTER — Other Ambulatory Visit: Payer: Self-pay

## 2018-01-05 ENCOUNTER — Encounter: Payer: Self-pay | Admitting: Family Medicine

## 2018-01-05 VITALS — BP 132/82 | HR 63 | Temp 97.5°F | Ht 71.0 in | Wt 163.6 lb

## 2018-01-05 DIAGNOSIS — L237 Allergic contact dermatitis due to plants, except food: Secondary | ICD-10-CM

## 2018-01-05 DIAGNOSIS — F419 Anxiety disorder, unspecified: Secondary | ICD-10-CM | POA: Insufficient documentation

## 2018-01-05 DIAGNOSIS — N401 Enlarged prostate with lower urinary tract symptoms: Secondary | ICD-10-CM | POA: Diagnosis not present

## 2018-01-05 DIAGNOSIS — R69 Illness, unspecified: Secondary | ICD-10-CM | POA: Diagnosis not present

## 2018-01-05 DIAGNOSIS — E785 Hyperlipidemia, unspecified: Secondary | ICD-10-CM

## 2018-01-05 DIAGNOSIS — N138 Other obstructive and reflux uropathy: Secondary | ICD-10-CM

## 2018-01-05 LAB — POC URINALSYSI DIPSTICK (AUTOMATED)
Bilirubin, UA: NEGATIVE
Blood, UA: NEGATIVE
Glucose, UA: NEGATIVE
Ketones, UA: NEGATIVE
Leukocytes, UA: NEGATIVE
Nitrite, UA: NEGATIVE
Protein, UA: NEGATIVE
Spec Grav, UA: 1.015 (ref 1.010–1.025)
Urobilinogen, UA: 0.2 E.U./dL
pH, UA: 6 (ref 5.0–8.0)

## 2018-01-05 LAB — HEPATIC FUNCTION PANEL
ALT: 17 U/L (ref 0–53)
AST: 18 U/L (ref 0–37)
Albumin: 4.3 g/dL (ref 3.5–5.2)
Alkaline Phosphatase: 60 U/L (ref 39–117)
Bilirubin, Direct: 0.2 mg/dL (ref 0.0–0.3)
Total Bilirubin: 1.1 mg/dL (ref 0.2–1.2)
Total Protein: 6.9 g/dL (ref 6.0–8.3)

## 2018-01-05 LAB — BASIC METABOLIC PANEL
BUN: 17 mg/dL (ref 6–23)
CALCIUM: 9.2 mg/dL (ref 8.4–10.5)
CO2: 28 meq/L (ref 19–32)
CREATININE: 1.17 mg/dL (ref 0.40–1.50)
Chloride: 101 mEq/L (ref 96–112)
GFR: 65.69 mL/min (ref >60.00)
GLUCOSE: 98 mg/dL (ref 70–99)
Potassium: 4.4 mEq/L (ref 3.5–5.1)
Sodium: 137 mEq/L (ref 135–145)

## 2018-01-05 LAB — CBC WITH DIFFERENTIAL/PLATELET
BASOS ABS: 0 10*3/uL (ref 0.0–0.1)
BASOS PCT: 0.6 % (ref 0.0–3.0)
Eosinophils Absolute: 0 10*3/uL (ref 0.0–0.7)
Eosinophils Relative: 0.9 % (ref 0.0–5.0)
HEMATOCRIT: 43.8 % (ref 39.0–52.0)
Hemoglobin: 14.9 g/dL (ref 13.0–17.0)
Lymphocytes Relative: 29.7 % (ref 12.0–46.0)
Lymphs Abs: 1.7 10*3/uL (ref 0.7–4.0)
MCHC: 34.1 g/dL (ref 30.0–36.0)
MCV: 93.7 fl (ref 78.0–100.0)
MONOS PCT: 7.2 % (ref 3.0–12.0)
Monocytes Absolute: 0.4 10*3/uL (ref 0.1–1.0)
NEUTROS ABS: 3.4 10*3/uL (ref 1.4–7.7)
Neutrophils Relative %: 61.6 % (ref 43.0–77.0)
PLATELETS: 213 10*3/uL (ref 150.0–400.0)
RBC: 4.67 Mil/uL (ref 4.22–5.81)
RDW: 12.1 % (ref 11.5–15.5)
WBC: 5.6 10*3/uL (ref 4.0–10.5)

## 2018-01-05 LAB — LIPID PANEL
Cholesterol: 206 mg/dL — ABNORMAL HIGH (ref 0–200)
HDL: 57.4 mg/dL (ref >39.00)
LDL Cholesterol: 125 mg/dL — ABNORMAL HIGH (ref 0–99)
NonHDL: 148.28
Total CHOL/HDL Ratio: 4
Triglycerides: 117 mg/dL (ref 0.0–149.0)
VLDL: 23.4 mg/dL (ref 0.0–40.0)

## 2018-01-05 LAB — TSH: TSH: 2.79 u[IU]/mL (ref 0.35–4.50)

## 2018-01-05 LAB — PSA: PSA: 1.03 ng/mL (ref 0.10–4.00)

## 2018-01-05 MED ORDER — METHYLPREDNISOLONE 4 MG PO TBPK: ORAL_TABLET | ORAL | 0 refills | Status: DC

## 2018-01-05 NOTE — Progress Notes (Signed)
   Subjective:    Patient ID: Christian Martin, male    DOB: December 16, 1950, 68 y.o.   MRN: [id number]  HPI Here to follow up on some issues and to ask about poison ivy. He was working in his yard last week and he got into some of this. He now has itchy rashes on both arms and both legs. Using Calamine lotion and Benadryl spray. He works out 3 days a week and watches his diet. His anxiety is stable.    Review of Systems  Constitutional: Negative.   HENT: Negative.   Eyes: Negative.   Respiratory: Negative.   Cardiovascular: Negative.   Gastrointestinal: Negative.   Genitourinary: Negative.   Musculoskeletal: Negative.   Skin: Positive for rash.  Neurological: Negative.   Psychiatric/Behavioral: Negative.        Objective:   Physical Exam  Constitutional: He is oriented to person, place, and time. He appears well-developed and well-nourished. No distress.  HENT:  Head: Normocephalic and atraumatic.  Right Ear: External ear normal.  Left Ear: External ear normal.  Nose: Nose normal.  Mouth/Throat: Oropharynx is clear and moist. No oropharyngeal exudate.  Eyes: Pupils are equal, round, and reactive to light. Conjunctivae and EOM are normal. Right eye exhibits no discharge. Left eye exhibits no discharge. No scleral icterus.  Neck: Neck supple. No JVD present. No tracheal deviation present. No thyromegaly present.  Cardiovascular: Normal rate, regular rhythm, normal heart sounds and intact distal pulses. Exam reveals no gallop and no friction rub.  No murmur heard. Pulmonary/Chest: Effort normal and breath sounds normal. No respiratory distress. He has no wheezes. He has no rales. He exhibits no tenderness.  Abdominal: Soft. Bowel sounds are normal. He exhibits no distension and no mass. There is no tenderness. There is no rebound and no guarding.  Genitourinary: Rectum normal, prostate normal and penis normal. Rectal exam shows guaiac negative stool. No penile tenderness.    Musculoskeletal: Normal range of motion. He exhibits no edema or tenderness.  Lymphadenopathy:    He has no cervical adenopathy.  Neurological: He is alert and oriented to person, place, and time. He has normal reflexes. He displays normal reflexes. No cranial nerve deficit. He exhibits normal muscle tone. Coordination normal.  Skin: Skin is warm and dry. He is not diaphoretic. No erythema. No pallor.  Scattered red maculopapular rash on the arms and legs   Psychiatric: He has a normal mood and affect. His behavior is normal. Judgment and thought content normal.          Assessment & Plan:  He has contact dermatitis and we will treat with a Medrol dose pack. Get fasting labs today to check his lipids, etc. His anxiety is stable.  Alysia Penna, MD

## 2018-01-18 ENCOUNTER — Encounter: Payer: Self-pay | Admitting: Family Medicine

## 2018-01-18 DIAGNOSIS — S0180XA Unspecified open wound of other part of head, initial encounter: Secondary | ICD-10-CM | POA: Diagnosis not present

## 2018-01-23 DIAGNOSIS — Z4802 Encounter for removal of sutures: Secondary | ICD-10-CM | POA: Diagnosis not present

## 2018-05-11 DIAGNOSIS — R69 Illness, unspecified: Secondary | ICD-10-CM | POA: Diagnosis not present

## 2018-06-26 DEATH — deceased

## 2018-08-20 DIAGNOSIS — Z0100 Encounter for examination of eyes and vision without abnormal findings: Secondary | ICD-10-CM | POA: Diagnosis not present

## 2018-08-20 DIAGNOSIS — H5213 Myopia, bilateral: Secondary | ICD-10-CM | POA: Diagnosis not present

## 2019-01-27 DIAGNOSIS — R69 Illness, unspecified: Secondary | ICD-10-CM | POA: Diagnosis not present

## 2019-02-14 ENCOUNTER — Encounter: Payer: Self-pay | Admitting: Family Medicine

## 2019-03-05 ENCOUNTER — Encounter: Payer: Self-pay | Admitting: Family Medicine

## 2019-03-08 ENCOUNTER — Encounter: Payer: Self-pay | Admitting: Family Medicine

## 2019-03-08 ENCOUNTER — Other Ambulatory Visit: Payer: Self-pay

## 2019-03-08 ENCOUNTER — Ambulatory Visit (INDEPENDENT_AMBULATORY_CARE_PROVIDER_SITE_OTHER): Payer: Medicare HMO | Admitting: Family Medicine

## 2019-03-08 VITALS — BP 124/70 | HR 69 | Temp 98.6°F | Wt 167.0 lb

## 2019-03-08 DIAGNOSIS — G629 Polyneuropathy, unspecified: Secondary | ICD-10-CM

## 2019-03-08 DIAGNOSIS — Z209 Contact with and (suspected) exposure to unspecified communicable disease: Secondary | ICD-10-CM | POA: Diagnosis not present

## 2019-03-08 DIAGNOSIS — Z0000 Encounter for general adult medical examination without abnormal findings: Secondary | ICD-10-CM | POA: Diagnosis not present

## 2019-03-08 LAB — POC URINALSYSI DIPSTICK (AUTOMATED)
Bilirubin, UA: NEGATIVE
Blood, UA: NEGATIVE
Glucose, UA: NEGATIVE
Ketones, UA: NEGATIVE
Leukocytes, UA: NEGATIVE
Nitrite, UA: NEGATIVE
Protein, UA: NEGATIVE
Spec Grav, UA: 1.02 (ref 1.010–1.025)
Urobilinogen, UA: 0.2 E.U./dL
pH, UA: 5.5 (ref 5.0–8.0)

## 2019-03-08 LAB — CBC WITH DIFFERENTIAL/PLATELET
Basophils Absolute: 0.1 10*3/uL (ref 0.0–0.1)
Basophils Relative: 1 % (ref 0.0–3.0)
Eosinophils Absolute: 0 10*3/uL (ref 0.0–0.7)
Eosinophils Relative: 0.4 % (ref 0.0–5.0)
HCT: 47.4 % (ref 39.0–52.0)
Hemoglobin: 15.3 g/dL (ref 13.0–17.0)
Lymphocytes Relative: 33 % (ref 12.0–46.0)
Lymphs Abs: 1.8 10*3/uL (ref 0.7–4.0)
MCHC: 32.3 g/dL (ref 30.0–36.0)
MCV: 96.6 fl (ref 78.0–100.0)
Monocytes Absolute: 0.4 10*3/uL (ref 0.1–1.0)
Monocytes Relative: 7.5 % (ref 3.0–12.0)
Neutro Abs: 3.1 10*3/uL (ref 1.4–7.7)
Neutrophils Relative %: 58.1 % (ref 43.0–77.0)
Platelets: 225 10*3/uL (ref 150.0–400.0)
RBC: 4.91 Mil/uL (ref 4.22–5.81)
RDW: 12.7 % (ref 11.5–15.5)
WBC: 5.4 10*3/uL (ref 4.0–10.5)

## 2019-03-08 LAB — PSA: PSA: 0.87 ng/mL (ref 0.10–4.00)

## 2019-03-08 LAB — VITAMIN B12: Vitamin B-12: 697 pg/mL (ref 211–911)

## 2019-03-08 LAB — TSH: TSH: 2.51 u[IU]/mL (ref 0.35–4.50)

## 2019-03-08 NOTE — Progress Notes (Signed)
Subjective:    Patient ID: Christian Martin, male    DOB: 12/08/1950, 68 y.o.   MRN: [id number]  HPI Here for a well exam. He has several issues to discuss. For the pasr year has had slight numbness on the bottoms of both feet. No tingling or pain. Also he says for 5 days this past February he felt feverish with body aches, and then this went away. He had no cough or SOB or NVD. He wonders if he could have had the Covid-19 virus at that time.    Review of Systems  Constitutional: Negative.   HENT: Negative.   Eyes: Negative.   Respiratory: Negative.   Cardiovascular: Negative.   Gastrointestinal: Negative.   Genitourinary: Negative.   Musculoskeletal: Negative.   Skin: Negative.   Neurological: Positive for numbness.  Psychiatric/Behavioral: Negative.        Objective:   Physical Exam Constitutional:      General: He is not in acute distress.    Appearance: He is well-developed. He is not diaphoretic.  HENT:     Head: Normocephalic and atraumatic.     Right Ear: External ear normal.     Left Ear: External ear normal.     Nose: Nose normal.     Mouth/Throat:     Pharynx: No oropharyngeal exudate.  Eyes:     General: No scleral icterus.       Right eye: No discharge.        Left eye: No discharge.     Conjunctiva/sclera: Conjunctivae normal.     Pupils: Pupils are equal, round, and reactive to light.  Neck:     Musculoskeletal: Neck supple.     Thyroid: No thyromegaly.     Vascular: No JVD.     Trachea: No tracheal deviation.  Cardiovascular:     Rate and Rhythm: Normal rate and regular rhythm.     Heart sounds: Normal heart sounds. No murmur. No friction rub. No gallop.   Pulmonary:     Effort: Pulmonary effort is normal. No respiratory distress.     Breath sounds: Normal breath sounds. No wheezing or rales.  Chest:     Chest wall: No tenderness.  Abdominal:     General: Bowel sounds are normal. There is no distension.     Palpations: Abdomen is soft. There is  no mass.     Tenderness: There is no abdominal tenderness. There is no guarding or rebound.  Genitourinary:    Penis: Normal. No tenderness.      Scrotum/Testes: Normal.     Prostate: Normal.     Rectum: Normal. Guaiac result negative.  Musculoskeletal: Normal range of motion.        General: No tenderness.  Lymphadenopathy:     Cervical: No cervical adenopathy.  Skin:    General: Skin is warm and dry.     Coloration: Skin is not pale.     Findings: No erythema or rash.  Neurological:     Mental Status: He is alert and oriented to person, place, and time.     Cranial Nerves: No cranial nerve deficit.     Motor: No abnormal muscle tone.     Coordination: Coordination normal.     Deep Tendon Reflexes: Reflexes are normal and symmetric. Reflexes normal.  Psychiatric:        Behavior: Behavior normal.        Thought Content: Thought content normal.        Judgment: Judgment normal.  Assessment & Plan:  Well exam. We discussed diet and exercise. Get fasting labs. He seems to have an early neuropathy in the feet so we will check a B12 level. Due to the symptoms he had in February, we will check for Covid antibodies today.  Alysia Penna, MD

## 2019-03-09 LAB — HEPATIC FUNCTION PANEL
ALT: 17 U/L (ref 0–53)
AST: 17 U/L (ref 0–37)
Albumin: 4.8 g/dL (ref 3.5–5.2)
Alkaline Phosphatase: 61 U/L (ref 39–117)
Bilirubin, Direct: 0.1 mg/dL (ref 0.0–0.3)
Total Bilirubin: 1.1 mg/dL (ref 0.2–1.2)
Total Protein: 7.1 g/dL (ref 6.0–8.3)

## 2019-03-09 LAB — LIPID PANEL
Cholesterol: 201 mg/dL — ABNORMAL HIGH (ref 0–200)
HDL: 52.6 mg/dL (ref >39.00)
LDL Cholesterol: 125 mg/dL — ABNORMAL HIGH (ref 0–99)
NonHDL: 148.62
Total CHOL/HDL Ratio: 4
Triglycerides: 119 mg/dL (ref 0.0–149.0)
VLDL: 23.8 mg/dL (ref 0.0–40.0)

## 2019-03-09 LAB — BASIC METABOLIC PANEL WITH GFR
BUN: 20 mg/dL (ref 6–23)
CO2: 26 meq/L (ref 19–32)
Calcium: 9.1 mg/dL (ref 8.4–10.5)
Chloride: 104 meq/L (ref 96–112)
Creatinine, Ser: 1.01 mg/dL (ref 0.40–1.50)
GFR: 72.99 mL/min (ref >60.00)
Glucose, Bld: 91 mg/dL (ref 70–99)
Potassium: 4.8 meq/L (ref 3.5–5.1)
Sodium: 140 meq/L (ref 135–145)

## 2019-03-09 LAB — SAR COV2 SEROLOGY (COVID19)AB(IGG),IA: SARS CoV2 AB IGG: NEGATIVE

## 2019-04-22 DIAGNOSIS — R69 Illness, unspecified: Secondary | ICD-10-CM | POA: Diagnosis not present

## 2019-05-07 ENCOUNTER — Encounter: Payer: Self-pay | Admitting: Family Medicine

## 2019-05-13 ENCOUNTER — Encounter: Payer: Self-pay | Admitting: Family Medicine

## 2019-08-24 ENCOUNTER — Encounter: Payer: Self-pay | Admitting: Family Medicine

## 2019-09-12 ENCOUNTER — Encounter: Payer: Self-pay | Admitting: Family Medicine

## 2019-09-21 ENCOUNTER — Ambulatory Visit: Payer: Medicare HMO

## 2019-09-30 ENCOUNTER — Ambulatory Visit: Payer: Medicare HMO | Attending: Internal Medicine

## 2019-09-30 DIAGNOSIS — Z23 Encounter for immunization: Secondary | ICD-10-CM | POA: Insufficient documentation

## 2019-09-30 NOTE — Progress Notes (Signed)
   Covid-19 Vaccination Clinic  Name:  Christian Martin    MRN: [id number] DOB: 1951-07-15  09/30/2019  Christian Martin was observed post Covid-19 immunization for 15 minutes without incidence. He was provided with Vaccine Information Sheet and instruction to access the V-Safe system.   Christian Martin was instructed to call 911 with any severe reactions post vaccine: Marland Kitchen Difficulty breathing  . Swelling of your face and throat  . A fast heartbeat  . A bad rash all over your body  . Dizziness and weakness    Immunizations Administered    Name Date Dose VIS Date Route   Pfizer COVID-19 Vaccine 09/30/2019  9:03 AM 0.3 mL 08/06/2019 Intramuscular   Manufacturer: [place]   Lot: [id number]   [place]: [id number]

## 2019-10-12 ENCOUNTER — Ambulatory Visit: Payer: Medicare HMO

## 2019-10-25 ENCOUNTER — Ambulatory Visit: Payer: Medicare HMO | Attending: Internal Medicine

## 2019-10-25 DIAGNOSIS — Z23 Encounter for immunization: Secondary | ICD-10-CM | POA: Insufficient documentation

## 2019-10-25 NOTE — Progress Notes (Signed)
   Covid-19 Vaccination Clinic  Name:  Christian Martin    MRN: [id number] DOB: August 15, 1951  10/25/2019  Christian Martin was observed post Covid-19 immunization for 15 minutes without incidence. He was provided with Vaccine Information Sheet and instruction to access the V-Safe system.   Christian Martin was instructed to call 911 with any severe reactions post vaccine: Marland Kitchen Difficulty breathing  . Swelling of your face and throat  . A fast heartbeat  . A bad rash all over your body  . Dizziness and weakness    Immunizations Administered    Name Date Dose VIS Date Route   Pfizer COVID-19 Vaccine 10/25/2019  1:27 PM 0.3 mL 08/06/2019 Intramuscular   Manufacturer: [place]   Lot: [id number]   [place]: [id number]

## 2019-11-01 ENCOUNTER — Other Ambulatory Visit: Payer: Self-pay

## 2019-11-01 ENCOUNTER — Ambulatory Visit (AMBULATORY_SURGERY_CENTER): Payer: Self-pay | Admitting: *Deleted

## 2019-11-01 VITALS — Temp 98.0°F | Ht 71.0 in | Wt 169.4 lb

## 2019-11-01 DIAGNOSIS — Z860100 Personal history of colon polyps, unspecified: Secondary | ICD-10-CM

## 2019-11-01 DIAGNOSIS — Z1211 Encounter for screening for malignant neoplasm of colon: Secondary | ICD-10-CM

## 2019-11-01 NOTE — Progress Notes (Signed)

## 2019-11-08 ENCOUNTER — Encounter: Payer: Self-pay | Admitting: Gastroenterology

## 2019-11-09 ENCOUNTER — Encounter: Payer: Self-pay | Admitting: Gastroenterology

## 2019-11-09 ENCOUNTER — Ambulatory Visit (AMBULATORY_SURGERY_CENTER): Payer: Medicare HMO | Admitting: Gastroenterology

## 2019-11-09 ENCOUNTER — Other Ambulatory Visit: Payer: Self-pay

## 2019-11-09 VITALS — BP 133/84 | HR 56 | Temp 96.8°F | Resp 27 | Ht 71.0 in | Wt 169.4 lb

## 2019-11-09 DIAGNOSIS — Z86010 Personal history of colonic polyps: Secondary | ICD-10-CM

## 2019-11-09 DIAGNOSIS — Z85038 Personal history of other malignant neoplasm of large intestine: Secondary | ICD-10-CM | POA: Diagnosis not present

## 2019-11-09 DIAGNOSIS — D125 Benign neoplasm of sigmoid colon: Secondary | ICD-10-CM

## 2019-11-09 HISTORY — PX: COLONOSCOPY: SHX174

## 2019-11-09 MED ORDER — SODIUM CHLORIDE 0.9 % IV SOLN: 500.0000 mL | Freq: Once | INTRAVENOUS | Status: DC

## 2019-11-09 NOTE — Progress Notes (Signed)
pt tolerated well. VSS. awake and to recovery. Report given to RN.  

## 2019-11-09 NOTE — Progress Notes (Signed)
Temp by LC, vitals by DT   Pt's states no medical or surgical changes since previsit or office visit.

## 2019-11-09 NOTE — Patient Instructions (Signed)
Handouts given:  Diverticulosis, Hemorrhoids, Polyps Resume previous diet Continue current medications Await pathology results.   YOU HAD AN ENDOSCOPIC PROCEDURE TODAY AT THE [facility]:   Refer to the procedure report that was given to you for any specific questions about what was found during the examination.  If the procedure report does not answer your questions, please call your gastroenterologist to clarify.  If you requested that your care partner not be given the details of your procedure findings, then the procedure report has been included in a sealed envelope for you to review at your convenience later.  YOU SHOULD EXPECT: Some feelings of bloating in the abdomen. Passage of more gas than usual.  Walking can help get rid of the air that was put into your GI tract during the procedure and reduce the bloating. If you had a lower endoscopy (such as a colonoscopy or flexible sigmoidoscopy) you may notice spotting of blood in your stool or on the toilet paper. If you underwent a bowel prep for your procedure, you may not have a normal bowel movement for a few days.  Please Note:  You might notice some irritation and congestion in your nose or some drainage.  This is from the oxygen used during your procedure.  There is no need for concern and it should clear up in a day or so.  SYMPTOMS TO REPORT IMMEDIATELY:   Following lower endoscopy (colonoscopy or flexible sigmoidoscopy):  Excessive amounts of blood in the stool  Significant tenderness or worsening of abdominal pains  Swelling of the abdomen that is new, acute  Fever of 100F or higher    For urgent or emergent issues, a gastroenterologist can be reached at any hour by calling [phone]. Do not use MyChart messaging for urgent concerns.    DIET:  We do recommend a small meal at first, but then you may proceed to your regular diet.  Drink plenty of fluids but you should avoid alcoholic beverages for 24  hours.  ACTIVITY:  You should plan to take it easy for the rest of today and you should NOT DRIVE or use heavy machinery until tomorrow (because of the sedation medicines used during the test).    FOLLOW UP: Our staff will call the number listed on your records 48-72 hours following your procedure to check on you and address any questions or concerns that you may have regarding the information given to you following your procedure. If we do not reach you, we will leave a message.  We will attempt to reach you two times.  During this call, we will ask if you have developed any symptoms of COVID 19. If you develop any symptoms (ie: fever, flu-like symptoms, shortness of breath, cough etc.) before then, please call [phone].  If you test positive for Covid 19 in the 2 weeks post procedure, please call and report this information to us.    If any biopsies were taken you will be contacted by phone or by letter within the next 1-3 weeks.  Please call us at [phone] if you have not heard about the biopsies in 3 weeks.    SIGNATURES/CONFIDENTIALITY: You and/or your care partner have signed paperwork which will be entered into your electronic medical record.  These signatures attest to the fact that that the information above on your After Visit Summary has been reviewed and is understood.  Full responsibility of the confidentiality of this discharge information lies with you and/or your care-partner. 

## 2019-11-09 NOTE — Op Note (Signed)
[place] Patient Name: Christian Martin Procedure Date: 11/09/2019 10:38 AM MRN: [id number] Endoscopist: Remo Lipps P. Havery Moros , MD Age: 69 Referring MD:  Date of Birth: 06-15-1951 Gender: Male Account #: [id number] Procedure:                Colonoscopy Indications:              Screening for colorectal malignant neoplasm, This                            is the patient's first colonoscopy Medicines:                Monitored Anesthesia Care Procedure:                Pre-Anesthesia Assessment:                           - Prior to the procedure, a History and Physical                            was performed, and patient medications and                            allergies were reviewed. The patient's tolerance of                            previous anesthesia was also reviewed. The risks                            and benefits of the procedure and the sedation                            options and risks were discussed with the patient.                            All questions were answered, and informed consent                            was obtained. Prior Anticoagulants: The patient has                            taken no previous anticoagulant or antiplatelet                            agents. ASA Grade Assessment: II - A patient with                            mild systemic disease. After reviewing the risks                            and benefits, the patient was deemed in                            satisfactory condition to undergo the procedure.  After obtaining informed consent, the colonoscope                            was passed under direct vision. Throughout the                            procedure, the patient's blood pressure, pulse, and                            oxygen saturations were monitored continuously. The                            Colonoscope was introduced through the anus and                            advanced to the the  cecum, identified by                            appendiceal orifice and ileocecal valve. The                            colonoscopy was performed without difficulty. The                            patient tolerated the procedure well. The quality                            of the bowel preparation was good. The ileocecal                            valve, appendiceal orifice, and rectum were                            photographed. Scope In: 10:51:57 AM Scope Out: 11:11:12 AM Scope Withdrawal Time: 0 hours 17 minutes 2 seconds  Total Procedure Duration: 0 hours 19 minutes 15 seconds  Findings:                 The perianal and digital rectal examinations were                            normal.                           Two sessile polyps were found in the sigmoid colon.                            The polyps were 3 mm in size. These polyps were                            removed with a cold snare. Resection and retrieval                            were complete.  A few small-mouthed diverticula were found in the                            sigmoid colon, ascending colon and cecum.                           Internal hemorrhoids were found during                            retroflexion. The hemorrhoids were small.                           Anal papilla(e) were hypertrophied.                           The exam was otherwise without abnormality. Complications:            No immediate complications. Estimated blood loss:                            Minimal. Estimated Blood Loss:     Estimated blood loss was minimal. Impression:               - Two 3 mm polyps in the sigmoid colon, removed                            with a cold snare. Resected and retrieved.                           - Diverticulosis in the sigmoid colon and in the                            cecum.                           - Internal hemorrhoids.                           - Anal papilla(e) were  hypertrophied.                           - The examination was otherwise normal. Recommendation:           - Patient has a contact number available for                            emergencies. The signs and symptoms of potential                            delayed complications were discussed with the                            patient. Return to normal activities tomorrow.                            Written discharge instructions were provided to the  patient.                           - Resume previous diet.                           - Continue present medications.                           - Await pathology results. Remo Lipps P. Dameian Crisman, MD 11/09/2019 11:15:44 AM This report has been signed electronically.

## 2019-11-09 NOTE — Progress Notes (Signed)
Called to room to assist during endoscopic procedure.  Patient ID and intended procedure confirmed with present staff. Received instructions for my participation in the procedure from the performing physician.  

## 2019-11-11 ENCOUNTER — Telehealth: Payer: Self-pay | Admitting: *Deleted

## 2019-11-11 ENCOUNTER — Telehealth: Payer: Self-pay

## 2019-11-11 NOTE — Telephone Encounter (Signed)
Second attempt follow up call to pt, lm on vm. 

## 2019-11-11 NOTE — Telephone Encounter (Signed)
  Follow up Call-  Call back number 11/09/2019  Post procedure Call Back phone  # [phone]  Permission to leave phone message Yes  Some recent data might be hidden     Patient questions:  Message left to call us if necessary.

## 2020-01-31 DIAGNOSIS — R69 Illness, unspecified: Secondary | ICD-10-CM | POA: Diagnosis not present

## 2020-02-28 ENCOUNTER — Encounter: Payer: Self-pay | Admitting: Family Medicine

## 2020-02-29 ENCOUNTER — Encounter: Payer: Self-pay | Admitting: Family Medicine

## 2020-02-29 ENCOUNTER — Telehealth (INDEPENDENT_AMBULATORY_CARE_PROVIDER_SITE_OTHER): Payer: Medicare HMO | Admitting: Family Medicine

## 2020-02-29 DIAGNOSIS — J069 Acute upper respiratory infection, unspecified: Secondary | ICD-10-CM | POA: Diagnosis not present

## 2020-02-29 MED ORDER — HYDROCODONE-HOMATROPINE 5-1.5 MG/5ML PO SYRP: 5.0000 mL | ORAL_SOLUTION | ORAL | 0 refills | Status: DC | PRN

## 2020-02-29 MED ORDER — METHYLPREDNISOLONE 4 MG PO TBPK: ORAL_TABLET | ORAL | 0 refills | Status: DC

## 2020-02-29 NOTE — Telephone Encounter (Signed)
Pt has been scheduled.  °

## 2020-02-29 NOTE — Progress Notes (Signed)
Subjective:    Patient ID: Christian Martin, male    DOB: 1950/12/02, 69 y.o.   MRN: [id number]  HPI Virtual Visit via Video Note  I connected with the patient on 02/29/20 at  4:15 PM EDT by a video enabled telemedicine application and verified that I am speaking with the correct person using two identifiers.  Location patient: home Location provider:work or home office Persons participating in the virtual visit: patient, provider  I discussed the limitations of evaluation and management by telemedicine and the availability of in person appointments. The patient expressed understanding and agreed to proceed.   HPI: Here for 5 days of coughing which is non-productive. The cough makes him wheeze at times. No chest pain or SOB. He has had low grade fevers to 99.8 degrees. hehas tried Dayquil and Mucinex with mixed results. Drinking fluids.   ROS: See pertinent positives and negatives per HPI.  Past Medical History:  Diagnosis Date  . Hx of adenomatous colonic polyps   . Hyperlipidemia   . Retinal tear of right eye 2016   saw Dr. Prudencio Burly     Past Surgical History:  Procedure Laterality Date  . COLONOSCOPY  10/03/2009   per Dr. Deatra Ina, clear, repeat in 10 yrs   . LACERATION REPAIR     Please update my chart: 01-18-18 Lacerated forehead wound 1.5" treated with 6 sutures at [facility] on [address].   . WISDOM TOOTH EXTRACTION      Family History  Problem Relation Age of Onset  . Cancer Sister        breast  . Osteoporosis Mother   . Hypertension Mother   . Heart failure Father   . Breast cancer Other   . Coronary artery disease Other   . Colon cancer Neg Hx   . Esophageal cancer Neg Hx   . Rectal cancer Neg Hx   . Stomach cancer Neg Hx      Current Outpatient Medications:  .  ALFALFA PO, Take by mouth daily. 2.4 grams, Disp: , Rfl:  .  Alpha-Lipoic Acid 100 MG CAPS, Take 100 mg by mouth daily., Disp: , Rfl:  .  Ashwagandha 125 MG CAPS, Take 125 mg by mouth daily., Disp: ,  Rfl:  .  aspirin 81 MG tablet, Take 81 mg by mouth daily.  , Disp: , Rfl:  .  Cholecalciferol (VITAMIN D PO), Take by mouth. 200 IV, Disp: , Rfl:  .  Coenzyme Q10 (CO Q 10 PO), Take by mouth daily., Disp: , Rfl:  .  ferrous sulfate 324 (65 Fe) MG TBEC, Take 1 tablet (325 mg total) by mouth 2 (two) times daily., Disp: 60 tablet, Rfl: 11 .  fish oil-omega-3 fatty acids 1000 MG capsule, Take 2 g by mouth daily.  , Disp: , Rfl:  .  Garlic 3545 MG CAPS, Take by mouth.  , Disp: , Rfl:  .  Green Tea, Camellia sinensis, (GREEN TEA EXTRACT PO), Take 1 capsule by mouth daily. Also known as EGCG, Disp: , Rfl:  .  HYDROcodone-homatropine (HYCODAN) 5-1.5 MG/5ML syrup, Take 5 mLs by mouth every 4 (four) hours as needed for cough., Disp: 240 mL, Rfl: 0 .  MELATONIN PO, Take 1 tablet by mouth at bedtime as needed., Disp: , Rfl:  .  methylPREDNISolone (MEDROL DOSEPAK) 4 MG TBPK tablet, As directed, Disp: 21 tablet, Rfl: 0 .  Multiple Vitamin (MULTIVITAMIN) tablet, Take 1 tablet by mouth daily.  , Disp: , Rfl:  .  Pantothenic  Acid 250 MG CAPS, Take 250 mg by mouth daily., Disp: , Rfl:  .  Probiotic Product (PROBIOTIC PO), Take by mouth daily., Disp: , Rfl:  .  ROYAL JELLY PO, Take 650 mg by mouth daily., Disp: , Rfl:  .  Safflower Oil 1000 MG CAPS, Take 1,000 mg by mouth daily., Disp: , Rfl:   EXAM:  VITALS per patient if applicable:  GENERAL: alert, oriented, appears well and in no acute distress  HEENT: atraumatic, conjunttiva clear, no obvious abnormalities on inspection of external nose and ears  NECK: normal movements of the head and neck  LUNGS: on inspection no signs of respiratory distress, breathing rate appears normal, no obvious gross SOB, gasping or wheezing  CV: no obvious cyanosis  MS: moves all visible extremities without noticeable abnormality  PSYCH/NEURO: pleasant and cooperative, no obvious depression or anxiety, speech and thought processing grossly intact  ASSESSMENT AND  PLAN: Viral URI. Given a Medrol dose pack. Use Hydromet for cough. We will follow this up next week at his well exam. Alysia Penna, MD  Discussed the following assessment and plan:  No diagnosis found.     I discussed the assessment and treatment plan with the patient. The patient was provided an opportunity to ask questions and all were answered. The patient agreed with the plan and demonstrated an understanding of the instructions.   The patient was advised to call back or seek an in-person evaluation if the symptoms worsen or if the condition fails to improve as anticipated.     Review of Systems     Objective:   Physical Exam        Assessment & Plan:

## 2020-03-01 NOTE — Telephone Encounter (Signed)
Please advise 

## 2020-03-02 NOTE — Telephone Encounter (Signed)
I think the best OTC cough syrup is Delsym, so I suggest he try that

## 2020-03-08 ENCOUNTER — Other Ambulatory Visit: Payer: Self-pay

## 2020-03-08 ENCOUNTER — Ambulatory Visit (INDEPENDENT_AMBULATORY_CARE_PROVIDER_SITE_OTHER): Payer: Medicare HMO | Admitting: Family Medicine

## 2020-03-08 ENCOUNTER — Encounter: Payer: Self-pay | Admitting: Family Medicine

## 2020-03-08 VITALS — BP 124/64 | HR 79 | Temp 98.2°F | Wt 168.2 lb

## 2020-03-08 DIAGNOSIS — Z0000 Encounter for general adult medical examination without abnormal findings: Secondary | ICD-10-CM | POA: Diagnosis not present

## 2020-03-08 DIAGNOSIS — G629 Polyneuropathy, unspecified: Secondary | ICD-10-CM | POA: Insufficient documentation

## 2020-03-08 NOTE — Progress Notes (Signed)
   Subjective:    Patient ID: Christian Martin, male    DOB: 1950-09-09, 69 y.o.   MRN: [id number]  HPI Here for a well exam. He feels fine and has no complaints. He recovered from a recent viral URI.    Review of Systems  Constitutional: Negative.   HENT: Negative.   Eyes: Negative.   Respiratory: Negative.   Cardiovascular: Negative.   Gastrointestinal: Negative.   Genitourinary: Negative.   Musculoskeletal: Negative.   Skin: Negative.   Neurological: Negative.   Psychiatric/Behavioral: Negative.        Objective:   Physical Exam Constitutional:      General: He is not in acute distress.    Appearance: He is well-developed. He is not diaphoretic.  HENT:     Head: Normocephalic and atraumatic.     Right Ear: External ear normal.     Left Ear: External ear normal.     Nose: Nose normal.     Mouth/Throat:     Pharynx: No oropharyngeal exudate.  Eyes:     General: No scleral icterus.       Right eye: No discharge.        Left eye: No discharge.     Conjunctiva/sclera: Conjunctivae normal.     Pupils: Pupils are equal, round, and reactive to light.  Neck:     Thyroid: No thyromegaly.     Vascular: No JVD.     Trachea: No tracheal deviation.  Cardiovascular:     Rate and Rhythm: Normal rate and regular rhythm.     Heart sounds: Normal heart sounds. No murmur heard.  No friction rub. No gallop.   Pulmonary:     Effort: Pulmonary effort is normal. No respiratory distress.     Breath sounds: Normal breath sounds. No wheezing or rales.  Chest:     Chest wall: No tenderness.  Abdominal:     General: Bowel sounds are normal. There is no distension.     Palpations: Abdomen is soft. There is no mass.     Tenderness: There is no abdominal tenderness. There is no guarding or rebound.  Genitourinary:    Penis: Normal. No tenderness.      Testes: Normal.     Prostate: Normal.     Rectum: Normal. Guaiac result negative.  Musculoskeletal:        General: No tenderness.  Normal range of motion.     Cervical back: Neck supple.  Lymphadenopathy:     Cervical: No cervical adenopathy.  Skin:    General: Skin is warm and dry.     Coloration: Skin is not pale.     Findings: No erythema or rash.  Neurological:     Mental Status: He is alert and oriented to person, place, and time.     Cranial Nerves: No cranial nerve deficit.     Motor: No abnormal muscle tone.     Coordination: Coordination normal.     Deep Tendon Reflexes: Reflexes are normal and symmetric. Reflexes normal.  Psychiatric:        Behavior: Behavior normal.        Thought Content: Thought content normal.        Judgment: Judgment normal.           Assessment & Plan:  Well exam. We discussed diet and exercise. Get fasting labs.  Alysia Penna, MD

## 2020-03-09 ENCOUNTER — Encounter: Payer: Self-pay | Admitting: Family Medicine

## 2020-03-09 LAB — BASIC METABOLIC PANEL WITH GFR
BUN: 21 mg/dL (ref 7–25)
CO2: 25 mmol/L (ref 20–32)
Calcium: 9.1 mg/dL (ref 8.6–10.3)
Chloride: 105 mmol/L (ref 98–110)
Creat: 1.08 mg/dL (ref 0.70–1.18)
Glucose, Bld: 85 mg/dL (ref 65–99)
Potassium: 4.3 mmol/L (ref 3.5–5.3)
Sodium: 139 mmol/L (ref 135–146)

## 2020-03-09 LAB — CBC WITH DIFFERENTIAL/PLATELET
Absolute Monocytes: 538 cells/uL (ref 200–950)
Basophils Absolute: 39 cells/uL (ref 0–200)
Basophils Relative: 0.5 %
Eosinophils Absolute: 86 cells/uL (ref 15–500)
Eosinophils Relative: 1.1 %
HCT: 47.7 % (ref 38.5–50.0)
Hemoglobin: 15.7 g/dL (ref 13.2–17.1)
Lymphs Abs: 2371 cells/uL (ref 850–3900)
MCH: 30.8 pg (ref 27.0–33.0)
MCHC: 32.9 g/dL (ref 32.0–36.0)
MCV: 93.7 fL (ref 80.0–100.0)
MPV: 9.6 fL (ref 7.5–12.5)
Monocytes Relative: 6.9 %
Neutro Abs: 4766 cells/uL (ref 1500–7800)
Neutrophils Relative %: 61.1 %
Platelets: 243 10*3/uL (ref 140–400)
RBC: 5.09 10*6/uL (ref 4.20–5.80)
RDW: 12.4 % (ref 11.0–15.0)
Total Lymphocyte: 30.4 %
WBC: 7.8 10*3/uL (ref 3.8–10.8)

## 2020-03-09 LAB — PSA: PSA: 0.5 ng/mL

## 2020-03-09 LAB — LIPID PANEL
Cholesterol: 208 mg/dL — ABNORMAL HIGH (ref <200)
HDL: 49 mg/dL (ref >=40)
LDL Cholesterol (Calc): 126 mg/dL (calc) — ABNORMAL HIGH
Non-HDL Cholesterol (Calc): 159 mg/dL (calc) — ABNORMAL HIGH (ref <130)
Total CHOL/HDL Ratio: 4.2 (calc) (ref <5.0)
Triglycerides: 212 mg/dL — ABNORMAL HIGH (ref <150)

## 2020-03-09 LAB — HEPATIC FUNCTION PANEL
AG Ratio: 1.6 (calc) (ref 1.0–2.5)
ALT: 16 U/L (ref 9–46)
AST: 14 U/L (ref 10–35)
Albumin: 4.2 g/dL (ref 3.6–5.1)
Alkaline phosphatase (APISO): 71 U/L (ref 35–144)
Bilirubin, Direct: 0.1 mg/dL (ref 0.0–0.2)
Globulin: 2.6 g/dL (ref 1.9–3.7)
Indirect Bilirubin: 0.8 mg/dL (ref 0.2–1.2)
Total Bilirubin: 0.9 mg/dL (ref 0.2–1.2)
Total Protein: 6.8 g/dL (ref 6.1–8.1)

## 2020-03-09 LAB — TSH: TSH: 2.28 m[IU]/L (ref 0.40–4.50)

## 2020-03-09 LAB — VITAMIN B12: Vitamin B-12: 1344 pg/mL — ABNORMAL HIGH (ref 200–1100)

## 2020-03-14 ENCOUNTER — Telehealth: Payer: Self-pay | Admitting: Family Medicine

## 2020-03-14 MED ORDER — METHYLPREDNISOLONE 4 MG PO TBPK
ORAL_TABLET | ORAL | 0 refills | Status: DC
Start: 2020-03-14 — End: 2020-09-12

## 2020-03-14 NOTE — Telephone Encounter (Signed)
Spoke with the patient. He is aware that a dose pack was called in for him.

## 2020-03-14 NOTE — Telephone Encounter (Signed)
Rx sent in. Left message for patient to call back.

## 2020-03-14 NOTE — Addendum Note (Signed)
Addended by: Rebecca Eaton on: 03/14/2020 01:53 PM   Modules accepted: Orders

## 2020-03-14 NOTE — Telephone Encounter (Signed)
Patient wants to let Dr. Sarajane Jews know that he is sick again with the same symptoms as before. He wants to know if another prescription is in order.  [id number] [address], [place] Phone:  [phone]  Fax:  [phone]         Please advise

## 2020-03-14 NOTE — Telephone Encounter (Signed)
Call in another 4 mg Medrol dose pack as directed

## 2020-06-23 DIAGNOSIS — R69 Illness, unspecified: Secondary | ICD-10-CM | POA: Diagnosis not present

## 2020-09-01 ENCOUNTER — Encounter: Payer: Self-pay | Admitting: Family Medicine

## 2020-09-01 MED ORDER — TRAZODONE HCL 100 MG PO TABS
100.0000 mg | ORAL_TABLET | Freq: Every day | ORAL | 5 refills | Status: DC
Start: 2020-09-01 — End: 2020-09-07

## 2020-09-01 NOTE — Telephone Encounter (Signed)
Call in Trazodone 100 mg qhs, #30 with 5 rf

## 2020-09-06 ENCOUNTER — Encounter: Payer: Self-pay | Admitting: Family Medicine

## 2020-09-07 MED ORDER — TRAZODONE HCL 50 MG PO TABS
50.0000 mg | ORAL_TABLET | Freq: Every evening | ORAL | 5 refills | Status: DC | PRN
Start: 2020-09-07 — End: 2020-10-02

## 2020-09-07 NOTE — Telephone Encounter (Signed)
I sent in for the 50 mg tablets

## 2020-09-12 ENCOUNTER — Ambulatory Visit: Payer: Medicare HMO

## 2020-09-12 ENCOUNTER — Telehealth (INDEPENDENT_AMBULATORY_CARE_PROVIDER_SITE_OTHER): Payer: Medicare HMO

## 2020-09-12 DIAGNOSIS — Z0000 Encounter for general adult medical examination without abnormal findings: Secondary | ICD-10-CM

## 2020-09-12 NOTE — Progress Notes (Unsigned)
Subjective:   KOVEN BELINSKY is a 70 y.o. male who presents for an Initial Medicare Annual Wellness Visit.  Review of Systems    N/A        Objective:    There were no vitals filed for this visit. There is no height or weight on file to calculate BMI.  Advanced Directives 06/21/2016  Does Patient Have a Medical Advance Directive? No  Would patient like information on creating a medical advance directive? No - patient declined information    Current Medications (verified) Outpatient Encounter Medications as of 09/12/2020  Medication Sig  . ALFALFA PO Take by mouth daily. 2.4 grams  . aspirin 81 MG tablet Take 81 mg by mouth daily.    . Cholecalciferol (VITAMIN D PO) Take by mouth. 200 IV  . ferrous sulfate 324 (65 Fe) MG TBEC Take 1 tablet (325 mg total) by mouth 2 (two) times daily.  . fish oil-omega-3 fatty acids 1000 MG capsule Take 2 g by mouth daily.    . Garlic 6045 MG CAPS Take by mouth.    Nyoka Cowden Tea, Camellia sinensis, (GREEN TEA EXTRACT PO) Take 1 capsule by mouth daily. Also known as EGCG  . MELATONIN PO Take 1 tablet by mouth at bedtime as needed.  . methylPREDNISolone (MEDROL DOSEPAK) 4 MG TBPK tablet Take as directed.  . Multiple Vitamin (MULTIVITAMIN) tablet Take 1 tablet by mouth daily.    . Probiotic Product (PROBIOTIC PO) Take by mouth daily.  . traZODone (DESYREL) 50 MG tablet Take 1 tablet (50 mg total) by mouth at bedtime as needed for sleep.   No facility-administered encounter medications on file as of 09/12/2020.    Allergies (verified) Penicillins   History: Past Medical History:  Diagnosis Date  . Hx of adenomatous colonic polyps   . Hyperlipidemia   . Retinal tear of right eye 2016   saw Dr. Prudencio Burly    Past Surgical History:  Procedure Laterality Date  . COLONOSCOPY  11/09/2019   per Dr. Havery Moros, adenomatous polyp, repeat in 7 yrs   . LACERATION REPAIR     Please update my chart: 01-18-18 Lacerated forehead wound 1.5" treated with 6  sutures at [facility] on [address].   . WISDOM TOOTH EXTRACTION     Family History  Problem Relation Age of Onset  . Cancer Sister        breast  . Osteoporosis Mother   . Hypertension Mother   . Heart failure Father   . Breast cancer Other   . Coronary artery disease Other   . Colon cancer Neg Hx   . Esophageal cancer Neg Hx   . Rectal cancer Neg Hx   . Stomach cancer Neg Hx    Social History   Socioeconomic History  . Marital status: Single    Spouse name: Not on file  . Number of children: Not on file  . Years of education: Not on file  . Highest education level: Not on file  Occupational History  . Not on file  Tobacco Use  . Smoking status: Never Smoker  . Smokeless tobacco: Never Used  Substance and Sexual Activity  . Alcohol use: Not Currently    Alcohol/week: 0.0 standard drinks    Comment: glass of wine each day  . Drug use: No  . Sexual activity: Not on file  Other Topics Concern  . Not on file  Social History Narrative  . Not on file   Social Determinants of Health  Financial Resource Strain: Not on file  Food Insecurity: Not on file  Transportation Needs: Not on file  Physical Activity: Not on file  Stress: Not on file  Social Connections: Not on file    Tobacco Counseling Counseling given: Not Answered   Clinical Intake:                 Diabetic?No          Activities of Daily Living No flowsheet data found.  Patient Care Team: Nelwyn Salisbury, MD as PCP - General  Indicate any recent Medical Services you may have received from other than [facility] providers in the past year (date may be approximate).     Assessment:   This is a routine wellness examination for Lynk.  Hearing/Vision screen No exam data present  Dietary issues and exercise activities discussed:    Goals    . Exercise 150 minutes per week (moderate activity)     Try core work; yoga or other     . patient     Continue to work on brain function and  work word puzzles         Depression Screen PHQ 2/9 Scores 06/21/2016 01/12/2015  PHQ - 2 Score 0 0    Fall Risk Fall Risk  01/05/2018 06/21/2016 01/12/2015  Falls in the past year? No No No    FALL RISK PREVENTION PERTAINING TO THE HOME:  Any stairs in or around the home? {YES/NO:21197} If so, are there any without handrails? No  Home free of loose throw rugs in walkways, pet beds, electrical cords, etc? Yes  Adequate lighting in your home to reduce risk of falls? Yes   ASSISTIVE DEVICES UTILIZED TO PREVENT FALLS:  Life alert? {YES/NO:21197} Use of a cane, walker or w/c? {YES/NO:21197} Grab bars in the bathroom? {YES/NO:21197} Shower chair or bench in shower? {YES/NO:21197} Elevated toilet seat or a handicapped toilet? {YES/NO:21197}    Cognitive Function: MMSE - Mini Mental State Exam 06/21/2016  Not completed: (No Data)        Immunizations Immunization History  Administered Date(s) Administered  . Influenza, High Dose Seasonal PF 05/31/2017, 04/22/2019, 06/11/2020  . Influenza,inj,Quad PF,6+ Mos 04/21/2015, 05/06/2016  . Influenza-Unspecified 05/31/2017  . PFIZER(Purple Top)SARS-COV-2 Vaccination 09/30/2019, 10/25/2019, 05/28/2020  . Pneumococcal Conjugate-13 01/10/2014  . Pneumococcal Polysaccharide-23 04/21/2015  . Tdap 05/06/2016  . Zoster 01/08/2013  . Zoster Recombinat (Shingrix) 03/08/2019, 05/10/2019    TDAP status: Up to date  Flu Vaccine status: Up to date  Pneumococcal vaccine status: Up to date  Covid-19 vaccine status: Completed vaccines  Qualifies for Shingles Vaccine? Yes   Zostavax completed Yes   Shingrix Completed?: Yes  Screening Tests Health Maintenance  Topic Date Due  . TETANUS/TDAP  05/06/2026  . COLONOSCOPY (Pts 45-37yrs Insurance coverage will need to be confirmed)  11/08/2029  . INFLUENZA VACCINE  Completed  . COVID-19 Vaccine  Completed  . Hepatitis C Screening  Completed  . PNA vac Low Risk Adult  Completed     Health Maintenance  There are no preventive care reminders to display for this patient.  Colorectal cancer screening: Type of screening: Colonoscopy. Completed 11/09/2019. Repeat every 10 years  Lung Cancer Screening: (Low Dose CT Chest recommended if Age 39-80 years, 30 pack-year currently smoking OR have quit w/in 15years.) does not qualify.   Lung Cancer Screening Referral: N/A   Additional Screening:  Hepatitis C Screening: does qualify; Completed 01/03/2017  Vision Screening: Recommended annual ophthalmology exams for early detection  of glaucoma and other disorders of the eye. Is the patient up to date with their annual eye exam?  {YES/NO:21197} Who is the provider or what is the name of the office in which the patient attends annual eye exams? *** If pt is not established with a provider, would they like to be referred to a provider to establish care? {YES/NO:21197}.   Dental Screening: Recommended annual dental exams for proper oral hygiene  Community Resource Referral / Chronic Care Management: CRR required this visit?  No   CCM required this visit?  No      Plan:     I have personally reviewed and noted the following in the patient's chart:   . Medical and social history . Use of alcohol, tobacco or illicit drugs  . Current medications and supplements . Functional ability and status . Nutritional status . Physical activity . Advanced directives . List of other physicians . Hospitalizations, surgeries, and ER visits in previous 12 months . Vitals . Screenings to include cognitive, depression, and falls . Referrals and appointments  In addition, I have reviewed and discussed with patient certain preventive protocols, quality metrics, and best practice recommendations. A written personalized care plan for preventive services as well as general preventive health recommendations were provided to patient.     Ofilia Neas, LPN   [id number]   Nurse Notes:  Non e

## 2020-09-12 NOTE — Progress Notes (Signed)
Subjective:   Christian Martin is a 70 y.o. male who presents for an Initial Medicare Annual Wellness Visit.  Virtual Visit via Video Note  I connected with Christian Martin on 09/12/20 at  2:00 PM EST by a video enabled telemedicine application and verified that I am speaking with the correct person using two identifiers.  Location: Patient: Home  Provider: Office    I discussed the limitations of evaluation and management by telemedicine and the availability of in person appointments. The patient expressed understanding and agreed to proceed.   Christian Neas, LPN    Review of Systems    N/A  Cardiac Risk Factors include: advanced age (>33men, >22 women);male gender     Objective:    There were no vitals filed for this visit. There is no height or weight on file to calculate BMI.  Advanced Directives 09/12/2020 06/21/2016  Does Patient Have a Medical Advance Directive? Yes No  Type of Paramedic of [place];Living will -  Does patient want to make changes to medical advance directive? No - Patient declined -  Copy of [place] in Chart? Yes - validated most recent copy scanned in chart (See row information) -  Would patient like information on creating a medical advance directive? - No - patient declined information    Current Medications (verified) Outpatient Encounter Medications as of 09/12/2020  Medication Sig  . ALFALFA PO Take by mouth daily. 2.4 grams  . aspirin 81 MG tablet Take 81 mg by mouth daily.  . Cholecalciferol (VITAMIN D PO) Take by mouth. 200 IV  . fish oil-omega-3 fatty acids 1000 MG capsule Take 2 g by mouth daily.  . Garlic 1740 MG CAPS Take by mouth.  Nyoka Cowden Tea, Camellia sinensis, (GREEN TEA EXTRACT PO) Take 1 capsule by mouth daily. Also known as EGCG  . MELATONIN PO Take 1 tablet by mouth at bedtime as needed.  . Multiple Vitamin (MULTIVITAMIN) tablet Take 1 tablet by mouth daily.  . Probiotic Product  (PROBIOTIC PO) Take by mouth daily.  . traZODone (DESYREL) 50 MG tablet Take 1 tablet (50 mg total) by mouth at bedtime as needed for sleep.  . [DISCONTINUED] ferrous sulfate 324 (65 Fe) MG TBEC Take 1 tablet (325 mg total) by mouth 2 (two) times daily.  . [DISCONTINUED] methylPREDNISolone (MEDROL DOSEPAK) 4 MG TBPK tablet Take as directed.   No facility-administered encounter medications on file as of 09/12/2020.    Allergies (verified) Penicillins   History: Past Medical History:  Diagnosis Date  . Hx of adenomatous colonic polyps   . Hyperlipidemia   . Retinal tear of right eye 2016   saw Dr. Prudencio Burly    Past Surgical History:  Procedure Laterality Date  . COLONOSCOPY  11/09/2019   per Dr. Havery Moros, adenomatous polyp, repeat in 7 yrs   . LACERATION REPAIR     Please update my chart: 01-18-18 Lacerated forehead wound 1.5" treated with 6 sutures at [facility] on [address].   . WISDOM TOOTH EXTRACTION     Family History  Problem Relation Age of Onset  . Cancer Sister        breast  . Osteoporosis Mother   . Hypertension Mother   . Heart failure Father   . Breast cancer Other   . Coronary artery disease Other   . Colon cancer Neg Hx   . Esophageal cancer Neg Hx   . Rectal cancer Neg Hx   . Stomach  cancer Neg Hx    Social History   Socioeconomic History  . Marital status: Single    Spouse name: Not on file  . Number of children: Not on file  . Years of education: Not on file  . Highest education level: Not on file  Occupational History  . Not on file  Tobacco Use  . Smoking status: Never Smoker  . Smokeless tobacco: Never Used  Substance and Sexual Activity  . Alcohol use: Not Currently    Alcohol/week: 0.0 standard drinks    Comment: glass of wine each day  . Drug use: No  . Sexual activity: Not on file  Other Topics Concern  . Not on file  Social History Narrative  . Not on file   Social Determinants of Health   Financial Resource Strain: Low Risk   .  Difficulty of Paying Living Expenses: Not hard at all  Food Insecurity: No Food Insecurity  . Worried About Charity fundraiser in the Last Year: Never true  . Ran Out of Food in the Last Year: Never true  Transportation Needs: No Transportation Needs  . Lack of Transportation (Medical): No  . Lack of Transportation (Non-Medical): No  Physical Activity: Sufficiently Active  . Days of Exercise per Week: 7 days  . Minutes of Exercise per Session: 30 min  Stress: No Stress Concern Present  . Feeling of Stress : Not at all  Social Connections: Socially Isolated  . Frequency of Communication with Friends and Family: Once a week  . Frequency of Social Gatherings with Friends and Family: Once a week  . Attends Religious Services: Never  . Active Member of Clubs or Organizations: Yes  . Attends Archivist Meetings: Never  . Marital Status: Never married    Tobacco Counseling Counseling given: Not Answered   Clinical Intake:  Pre-visit preparation completed: Yes  Pain : No/denies pain     Nutritional Risks: None Diabetes: No  How often do you need to have someone help you when you read instructions, pamphlets, or other written materials from your doctor or pharmacy?: 1 - Never  Diabetic?No      Information entered by :: [place] of Daily Living In your present state of health, do you have any difficulty performing the following activities: 09/12/2020  Hearing? N  Vision? N  Difficulty concentrating or making decisions? N  Walking or climbing stairs? N  Dressing or bathing? N  Doing errands, shopping? N  Preparing Food and eating ? N  Using the Toilet? N  In the past six months, have you accidently leaked urine? N  Do you have problems with loss of bowel control? N  Managing your Medications? N  Managing your Finances? N  Housekeeping or managing your Housekeeping? N  Some recent data might be hidden    Patient Care Team: Laurey Morale,  MD as PCP - General  Indicate any recent Medical Services you may have received from other than [facility] providers in the past year (date may be approximate).     Assessment:   This is a routine wellness examination for Christian Martin.  Hearing/Vision screen  Hearing Screening   125Hz  250Hz  500Hz  1000Hz  2000Hz  3000Hz  4000Hz  6000Hz  8000Hz   Right ear:           Left ear:           Vision Screening Comments: Patient states gets eyes examined once per year   Dietary issues and exercise activities discussed:  Current Exercise Habits: Home exercise routine, Type of exercise: walking, Time (Minutes): 30, Frequency (Times/Week): 7, Weekly Exercise (Minutes/Week): 210, Intensity: Mild  Goals    . Exercise 150 minutes per week (moderate activity)     Try core work; yoga or other     . patient     Continue to work on brain function and work word puzzles         Depression Screen PHQ 2/9 Scores 09/12/2020 06/21/2016 01/12/2015  PHQ - 2 Score 0 0 0    Fall Risk Fall Risk  09/12/2020 01/05/2018 06/21/2016 01/12/2015  Falls in the past year? 0 No No No  Number falls in past yr: 0 - - -  Injury with Fall? 0 - - -  Risk for fall due to : No Fall Risks - - -  Follow up Falls evaluation completed;Falls prevention discussed - - -    FALL RISK PREVENTION PERTAINING TO THE HOME:  Any stairs in or around the home? No  If so, are there any without handrails? No  Home free of loose throw rugs in walkways, pet beds, electrical cords, etc? Yes  Adequate lighting in your home to reduce risk of falls? Yes   ASSISTIVE DEVICES UTILIZED TO PREVENT FALLS:  Life alert? No  Use of a cane, walker or w/c? No  Grab bars in the bathroom? No  Shower chair or bench in shower? No  Elevated toilet seat or a handicapped toilet? No     Cognitive Function: Normal cognitive status assessed by direct observation by this Nurse Health Advisor. No abnormalities found.   MMSE - Mini Mental State Exam 06/21/2016  Not  completed: (No Data)        Immunizations Immunization History  Administered Date(s) Administered  . Influenza, High Dose Seasonal PF 05/31/2017, 04/22/2019, 06/11/2020  . Influenza,inj,Quad PF,6+ Mos 04/21/2015, 05/06/2016  . Influenza-Unspecified 05/31/2017  . PFIZER(Purple Top)SARS-COV-2 Vaccination 09/30/2019, 10/25/2019, 05/28/2020  . Pneumococcal Conjugate-13 01/10/2014  . Pneumococcal Polysaccharide-23 04/21/2015  . Tdap 05/06/2016  . Zoster 01/08/2013  . Zoster Recombinat (Shingrix) 03/08/2019, 05/10/2019    TDAP status: Up to date  Flu Vaccine status: Up to date  Pneumococcal vaccine status: Up to date  Covid-19 vaccine status: Completed vaccines  Qualifies for Shingles Vaccine? Yes   Zostavax completed Yes   Shingrix Completed?: Yes  Screening Tests Health Maintenance  Topic Date Due  . TETANUS/TDAP  05/06/2026  . COLONOSCOPY (Pts 45-80yrs Insurance coverage will need to be confirmed)  11/08/2029  . INFLUENZA VACCINE  Completed  . COVID-19 Vaccine  Completed  . Hepatitis C Screening  Completed  . PNA vac Low Risk Adult  Completed    Health Maintenance  There are no preventive care reminders to display for this patient.  Colorectal cancer screening: Type of screening: Colonoscopy. Completed 11/09/2019. Repeat every 10 years  Lung Cancer Screening: (Low Dose CT Chest recommended if Age 58-80 years, 30 pack-year currently smoking OR have quit w/in 15years.) does not qualify.   Lung Cancer Screening Referral: N/A   Additional Screening:  Hepatitis C Screening: does qualify; Completed 01/03/2017  Vision Screening: Recommended annual ophthalmology exams for early detection of glaucoma and other disorders of the eye. Is the patient up to date with their annual eye exam?  Yes  Who is the provider or what is the name of the office in which the patient attends annual eye exams? [facility]  If pt is not established with a provider, would they like to  be  referred to a provider to establish care? No .   Dental Screening: Recommended annual dental exams for proper oral hygiene  Community Resource Referral / Chronic Care Management: CRR required this visit?  No   CCM required this visit?  No      Plan:     I have personally reviewed and noted the following in the patient's chart:   . Medical and social history . Use of alcohol, tobacco or illicit drugs  . Current medications and supplements . Functional ability and status . Nutritional status . Physical activity . Advanced directives . List of other physicians . Hospitalizations, surgeries, and ER visits in previous 12 months . Vitals . Screenings to include cognitive, depression, and falls . Referrals and appointments  In addition, I have reviewed and discussed with patient certain preventive protocols, quality metrics, and best practice recommendations. A written personalized care plan for preventive services as well as general preventive health recommendations were provided to patient.     Christian Neas, LPN   [id number]   Nurse Notes: None

## 2020-09-12 NOTE — Patient Instructions (Signed)
Christian Martin , Thank you for taking time to come for your Medicare Wellness Visit. I appreciate your ongoing commitment to your health goals. Please review the following plan we discussed and let me know if I can assist you in the future.   Screening recommendations/referrals: Colonoscopy: Up to date, next due 11/08/2029 Recommended yearly ophthalmology/optometry visit for glaucoma screening and checkup Recommended yearly dental visit for hygiene and checkup  Vaccinations: Influenza vaccine: Up to date, next due fall 2022  Pneumococcal vaccine: Completed series  Tdap vaccine: Up to date, next due 05/06/2026 Shingles vaccine: Completed series     Advanced directives: Copies on file   Conditions/risks identified: none   Next appointment: None   Preventive Care 69 Years and Older, Male Preventive care refers to lifestyle choices and visits with your health care provider that can promote health and wellness. What does preventive care include?  A yearly physical exam. This is also called an annual well check.  Dental exams once or twice a year.  Routine eye exams. Ask your health care provider how often you should have your eyes checked.  Personal lifestyle choices, including:  Daily care of your teeth and gums.  Regular physical activity.  Eating a healthy diet.  Avoiding tobacco and drug use.  Limiting alcohol use.  Practicing safe sex.  Taking low doses of aspirin every day.  Taking vitamin and mineral supplements as recommended by your health care provider. What happens during an annual well check? The services and screenings done by your health care provider during your annual well check will depend on your age, overall health, lifestyle risk factors, and family history of disease. Counseling  Your health care provider may ask you questions about your:  Alcohol use.  Tobacco use.  Drug use.  Emotional well-being.  Home and relationship well-being.  Sexual  activity.  Eating habits.  History of falls.  Memory and ability to understand (cognition).  Work and work Statistician. Screening  You may have the following tests or measurements:  Height, weight, and BMI.  Blood pressure.  Lipid and cholesterol levels. These may be checked every 5 years, or more frequently if you are over 3 years old.  Skin check.  Lung cancer screening. You may have this screening every year starting at age 18 if you have a 30-pack-year history of smoking and currently smoke or have quit within the past 15 years.  Fecal occult blood test (FOBT) of the stool. You may have this test every year starting at age 66.  Flexible sigmoidoscopy or colonoscopy. You may have a sigmoidoscopy every 5 years or a colonoscopy every 10 years starting at age 71.  Prostate cancer screening. Recommendations will vary depending on your family history and other risks.  Hepatitis C blood test.  Hepatitis B blood test.  Sexually transmitted disease (STD) testing.  Diabetes screening. This is done by checking your blood sugar (glucose) after you have not eaten for a while (fasting). You may have this done every 1-3 years.  Abdominal aortic aneurysm (AAA) screening. You may need this if you are a current or former smoker.  Osteoporosis. You may be screened starting at age 50 if you are at high risk. Talk with your health care provider about your test results, treatment options, and if necessary, the need for more tests. Vaccines  Your health care provider may recommend certain vaccines, such as:  Influenza vaccine. This is recommended every year.  Tetanus, diphtheria, and acellular pertussis (Tdap, Td) vaccine. You  may need a Td booster every 10 years.  Zoster vaccine. You may need this after age 64.  Pneumococcal 13-valent conjugate (PCV13) vaccine. One dose is recommended after age 9.  Pneumococcal polysaccharide (PPSV23) vaccine. One dose is recommended after age  27. Talk to your health care provider about which screenings and vaccines you need and how often you need them. This information is not intended to replace advice given to you by your health care provider. Make sure you discuss any questions you have with your health care provider. Document Released: 09/08/2015 Document Revised: 05/01/2016 Document Reviewed: 06/13/2015 Elsevier Interactive Patient Education  2017 [place] Prevention in the Home Falls can cause injuries. They can happen to people of all ages. There are many things you can do to make your home safe and to help prevent falls. What can I do on the outside of my home?  Regularly fix the edges of walkways and driveways and fix any cracks.  Remove anything that might make you trip as you walk through a door, such as a raised step or threshold.  Trim any bushes or trees on the path to your home.  Use bright outdoor lighting.  Clear any walking paths of anything that might make someone trip, such as rocks or tools.  Regularly check to see if handrails are loose or broken. Make sure that both sides of any steps have handrails.  Any raised decks and porches should have guardrails on the edges.  Have any leaves, snow, or ice cleared regularly.  Use sand or salt on walking paths during winter.  Clean up any spills in your garage right away. This includes oil or grease spills. What can I do in the bathroom?  Use night lights.  Install grab bars by the toilet and in the tub and shower. Do not use towel bars as grab bars.  Use non-skid mats or decals in the tub or shower.  If you need to sit down in the shower, use a plastic, non-slip stool.  Keep the floor dry. Clean up any water that spills on the floor as soon as it happens.  Remove soap buildup in the tub or shower regularly.  Attach bath mats securely with double-sided non-slip rug tape.  Do not have throw rugs and other things on the floor that can make  you trip. What can I do in the bedroom?  Use night lights.  Make sure that you have a light by your bed that is easy to reach.  Do not use any sheets or blankets that are too big for your bed. They should not hang down onto the floor.  Have a firm chair that has side arms. You can use this for support while you get dressed.  Do not have throw rugs and other things on the floor that can make you trip. What can I do in the kitchen?  Clean up any spills right away.  Avoid walking on wet floors.  Keep items that you use a lot in easy-to-reach places.  If you need to reach something above you, use a strong step stool that has a grab bar.  Keep electrical cords out of the way.  Do not use floor polish or wax that makes floors slippery. If you must use wax, use non-skid floor wax.  Do not have throw rugs and other things on the floor that can make you trip. What can I do with my stairs?  Do not leave any items  on the stairs.  Make sure that there are handrails on both sides of the stairs and use them. Fix handrails that are broken or loose. Make sure that handrails are as long as the stairways.  Check any carpeting to make sure that it is firmly attached to the stairs. Fix any carpet that is loose or worn.  Avoid having throw rugs at the top or bottom of the stairs. If you do have throw rugs, attach them to the floor with carpet tape.  Make sure that you have a light switch at the top of the stairs and the bottom of the stairs. If you do not have them, ask someone to add them for you. What else can I do to help prevent falls?  Wear shoes that:  Do not have high heels.  Have rubber bottoms.  Are comfortable and fit you well.  Are closed at the toe. Do not wear sandals.  If you use a stepladder:  Make sure that it is fully opened. Do not climb a closed stepladder.  Make sure that both sides of the stepladder are locked into place.  Ask someone to hold it for you, if  possible.  Clearly mark and make sure that you can see:  Any grab bars or handrails.  First and last steps.  Where the edge of each step is.  Use tools that help you move around (mobility aids) if they are needed. These include:  Canes.  Walkers.  Scooters.  Crutches.  Turn on the lights when you go into a dark area. Replace any light bulbs as soon as they burn out.  Set up your furniture so you have a clear path. Avoid moving your furniture around.  If any of your floors are uneven, fix them.  If there are any pets around you, be aware of where they are.  Review your medicines with your doctor. Some medicines can make you feel dizzy. This can increase your chance of falling. Ask your doctor what other things that you can do to help prevent falls. This information is not intended to replace advice given to you by your health care provider. Make sure you discuss any questions you have with your health care provider. Document Released: 06/08/2009 Document Revised: 01/18/2016 Document Reviewed: 09/16/2014 Elsevier Interactive Patient Education  2017 [employer].

## 2020-09-30 ENCOUNTER — Other Ambulatory Visit: Payer: Self-pay | Admitting: Family Medicine

## 2020-10-18 ENCOUNTER — Encounter: Payer: Self-pay | Admitting: Family Medicine

## 2020-10-19 MED ORDER — AZELASTINE HCL 0.15 % NA SOLN: 2.0000 | Freq: Two times a day (BID) | NASAL | 11 refills | Status: DC

## 2020-10-19 NOTE — Telephone Encounter (Signed)
I sent in for him to try Azelastine nasal sprays. This is different from a steroid spray, and it does not cause rebound congestion

## 2020-10-20 ENCOUNTER — Encounter: Payer: Self-pay | Admitting: Family Medicine

## 2020-10-23 ENCOUNTER — Other Ambulatory Visit: Payer: Self-pay

## 2020-12-27 ENCOUNTER — Encounter: Payer: Self-pay | Admitting: Family Medicine

## 2020-12-28 NOTE — Telephone Encounter (Signed)
Noted. I am glad the melatonin helps him sleep. He is using Azelastine nasal sprays. Tell him to add saline sprays several times daily

## 2021-01-10 ENCOUNTER — Telehealth: Payer: Self-pay | Admitting: Family Medicine

## 2021-01-10 NOTE — Progress Notes (Signed)
  Chronic Care Management   Note  01/10/2021 Name: MAJESTIC BRISTER MRN: [id number] DOB: 07-18-1951  LEOCADIO HEAL is a 70 y.o. year old male who is a primary care patient of Laurey Morale, MD. I reached out to [name] by phone today in response to a referral sent by Mr. MARLEN MOLLICA PCP, Laurey Morale, MD.   Mr. Majano was given information about Chronic Care Management services today including:  1. CCM service includes personalized support from designated clinical staff supervised by his physician, including individualized plan of care and coordination with other care providers 2. 24/7 contact phone numbers for assistance for urgent and routine care needs. 3. Service will only be billed when office clinical staff spend 20 minutes or more in a month to coordinate care. 4. Only one practitioner may furnish and bill the service in a calendar month. 5. The patient may stop CCM services at any time (effective at the end of the month) by phone call to the office staff.   Patient agreed to services and verbal consent obtained.   Follow up plan:   Glenn

## 2021-02-05 ENCOUNTER — Encounter: Payer: Self-pay | Admitting: Family Medicine

## 2021-02-14 ENCOUNTER — Telehealth: Payer: Self-pay | Admitting: Pharmacist

## 2021-02-14 NOTE — Chronic Care Management (AMB) (Signed)
    Chronic Care Management Pharmacy Assistant   Name: Christian Martin  MRN: [id number] DOB: 02-26-1951  Christian Martin is an 70 y.o. year old male who presents for his initial CCM visit with the clinical pharmacist.  Reason for Encounter: Chart Prep for initial visit with Jeni Salles on 02/19/21.    Conditions to be addressed/monitored: Anxiety and Neuropathy, BPH and Dyslipidemia.   Primary concerns for visit include:   Recent office visits:  09/12/20 Ofilia Neas LPN [facility]) - video visit for AWV. Discontinued ferrous sulfate and methylprednisolone 4mg . Follow up in 1 year.   03/08/20 Alysia Penna MD (PCP) -  seen for preventative health care. Discontinued  Alpha Lipoic Acid, Ashwagandha, Co Q10, Hydrocodone - Homatropine, methylprednisolone 4mg , Pantothenic acid, Royal Jelly and Safflower Oil. Labs ordered fasting and follow up as needed.   Recent consult visits:  None.   Hospital visits:  None in previous 6 months  Fill History:  AZELASTINE HCL 0.15 % NA SOLN 11/15/2020 30   TRAZODONE 50 MG TABLET 09/07/2020 30    Medications: Outpatient Encounter Medications as of 02/14/2021  Medication Sig   ALFALFA PO Take by mouth daily. 2.4 grams   aspirin 81 MG tablet Take 81 mg by mouth daily.   Azelastine HCl 0.15 % SOLN Place 2 sprays into the nose 2 (two) times daily.   Cholecalciferol (VITAMIN D PO) Take by mouth. 200 IV   fish oil-omega-3 fatty acids 1000 MG capsule Take 2 g by mouth daily.   Garlic 5956 MG CAPS Take by mouth.   Green Tea, Camellia sinensis, (GREEN TEA EXTRACT PO) Take 1 capsule by mouth daily. Also known as EGCG   MELATONIN PO Take 1 tablet by mouth at bedtime as needed.   Multiple Vitamin (MULTIVITAMIN) tablet Take 1 tablet by mouth daily.   Probiotic Product (PROBIOTIC PO) Take by mouth daily.   No facility-administered encounter medications on file as of 02/14/2021.    Have you seen any other providers since your last visit?  Any  changes in your medications or health?  Any side effects from any medications?  Do you have an symptoms or problems not managed by your medications?  Any concerns about your health right now?  Has your provider asked that you check blood pressure, blood sugar, or follow special diet at home?  Do you get any type of exercise on a regular basis?  Can you think of a goal you would like to reach for your health?  Do you have any problems getting your medications?  Is there anything that you would like to discuss during the appointment?   Please bring medications and supplements to appointment   Closed note per Jeni Salles the clinical pharmacist.   Star Rating Drugs:  None.   [name]  Clinical Pharmacist Assistant [phone]

## 2021-02-19 ENCOUNTER — Ambulatory Visit (INDEPENDENT_AMBULATORY_CARE_PROVIDER_SITE_OTHER): Payer: Medicare HMO | Admitting: Pharmacist

## 2021-02-19 ENCOUNTER — Other Ambulatory Visit: Payer: Self-pay

## 2021-02-19 DIAGNOSIS — G629 Polyneuropathy, unspecified: Secondary | ICD-10-CM

## 2021-02-19 DIAGNOSIS — E785 Hyperlipidemia, unspecified: Secondary | ICD-10-CM

## 2021-02-19 NOTE — Progress Notes (Signed)
Chronic Care Management Pharmacy Note  02/22/2021 Name:  Christian Martin MRN:  [id number] DOB:  11-12-1950  Summary: LDL is not at goal of < 100  Recommendations/Changes made from today's visit: -Recommended CAC scoring and statin therapy and patient will discuss with PCP at next office visit -Recommended ENT referral  Plan: Follow up in 1 year   Subjective: Christian Martin is an 70 y.o. year old male who is a primary patient of Laurey Morale, MD.  The CCM team was consulted for assistance with disease management and care coordination needs.    Engaged with patient face to face for initial visit in response to provider referral for pharmacy case management and/or care coordination services.   Consent to Services:  The patient was given the following information about Chronic Care Management services today, agreed to services, and gave verbal consent: 1. CCM service includes personalized support from designated clinical staff supervised by the primary care provider, including individualized plan of care and coordination with other care providers 2. 24/7 contact phone numbers for assistance for urgent and routine care needs. 3. Service will only be billed when office clinical staff spend 20 minutes or more in a month to coordinate care. 4. Only one practitioner may furnish and bill the service in a calendar month. 5.The patient may stop CCM services at any time (effective at the end of the month) by phone call to the office staff. 6. The patient will be responsible for cost sharing (co-pay) of up to 20% of the service fee (after annual deductible is met). Patient agreed to services and consent obtained.  Patient Care Team: Laurey Morale, MD as PCP - General Viona Gilmore, [facility] as Pharmacist (Pharmacist)  Recent office visits: 09/12/20 Ofilia Neas LPN [facility] Medicine) - video visit for AWV. Discontinued ferrous sulfate and methylprednisolone 85m. Follow up in 1 year.   03/08/20  SAlysia PennaMD (PCP) -  seen for preventative health care. Discontinued  Alpha Lipoic Acid, Ashwagandha, Co Q10, Hydrocodone - Homatropine, methylprednisolone 463m Pantothenic acid, Royal Jelly and Safflower Oil. Labs ordered fasting and follow up as needed.  Recent consult visits: None  Hospital visits: None in previous 6 months   Objective:  Lab Results  Component Value Date   CREATININE 1.08 03/08/2020   BUN 21 03/08/2020   GFR 72.99 03/08/2019   GFRNONAA 80.98 01/31/2009   NA 139 03/08/2020   K 4.3 03/08/2020   CALCIUM 9.1 03/08/2020   CO2 25 03/08/2020   GLUCOSE 85 03/08/2020    Lab Results  Component Value Date/Time   GFR 72.99 03/08/2019 10:12 AM   GFR 65.69 01/05/2018 08:59 AM    Last diabetic Eye exam: No results found for: HMDIABEYEEXA  Last diabetic Foot exam: No results found for: HMDIABFOOTEX   Lab Results  Component Value Date   CHOL 208 (H) 03/08/2020   HDL 49 03/08/2020   LDLCALC 126 (H) 03/08/2020   LDLDIRECT 143.5 12/31/2012   TRIG 212 (H) 03/08/2020   CHOLHDL 4.2 03/08/2020    Hepatic Function Latest Ref Rng & Units 03/08/2020 03/08/2019 01/05/2018  Total Protein 6.1 - 8.1 g/dL 6.8 7.1 6.9  Albumin 3.5 - 5.2 g/dL - 4.8 4.3  AST 10 - 35 U/L '14 17 18  ' ALT 9 - 46 U/L '16 17 17  ' Alk Phosphatase 39 - 117 U/L - 61 60  Total Bilirubin 0.2 - 1.2 mg/dL 0.9 1.1 1.1  Bilirubin, Direct 0.0 - 0.2 mg/dL 0.1 0.1 0.2  Lab Results  Component Value Date/Time   TSH 2.28 03/08/2020 08:30 AM   TSH 2.51 03/08/2019 10:12 AM    CBC Latest Ref Rng & Units 03/08/2020 03/08/2019 01/05/2018  WBC 3.8 - 10.8 Thousand/uL 7.8 5.4 5.6  Hemoglobin 13.2 - 17.1 g/dL 15.7 15.3 14.9  Hematocrit 38.5 - 50.0 % 47.7 47.4 43.8  Platelets 140 - 400 Thousand/uL 243 225.0 213.0    No results found for: VD25OH  Clinical ASCVD: No  The 10-year ASCVD risk score Mikey Bussing DC Jr., et al., 2013) is: 20.1%   Values used to calculate the score:     Age: 47 years     Sex: Male     Is  Non-Hispanic African American: No     Diabetic: No     Tobacco smoker: No     Systolic Blood Pressure: 696 mmHg     Is BP treated: No     HDL Cholesterol: 49 mg/dL     Total Cholesterol: 208 mg/dL    Depression screen [facility] 2/9 09/12/2020 06/21/2016 01/12/2015  Decreased Interest 0 0 0  Down, Depressed, Hopeless 0 0 0  PHQ - 2 Score 0 0 0      Social History   Tobacco Use  Smoking Status Never  Smokeless Tobacco Never   BP Readings from Last 3 Encounters:  03/08/20 124/64  11/09/19 133/84  03/08/19 124/70   Pulse Readings from Last 3 Encounters:  03/08/20 79  11/09/19 (!) 56  03/08/19 69   Wt Readings from Last 3 Encounters:  03/08/20 168 lb 3.2 oz (76.3 kg)  11/09/19 169 lb 6.4 oz (76.8 kg)  11/01/19 169 lb 6.4 oz (76.8 kg)   BMI Readings from Last 3 Encounters:  03/08/20 23.46 kg/m  11/09/19 23.63 kg/m  11/01/19 23.63 kg/m    Assessment/Interventions: Review of patient past medical history, allergies, medications, health status, including review of consultants reports, laboratory and other test data, was performed as part of comprehensive evaluation and provision of chronic care management services.   SDOH:  (Social Determinants of Health) assessments and interventions performed: Yes SDOH Interventions    Flowsheet Row Most Recent Value  SDOH Interventions   Financial Strain Interventions Intervention Not Indicated  Transportation Interventions Intervention Not Indicated      SDOH Screenings   Alcohol Screen: Low Risk    Last Alcohol Screening Score (AUDIT): 0  Depression (PHQ2-9): Low Risk    PHQ-2 Score: 0  Financial Resource Strain: Low Risk    Difficulty of Paying Living Expenses: Not hard at all  Food Insecurity: No Food Insecurity   Worried About Charity fundraiser in the Last Year: Never true   Ran Out of Food in the Last Year: Never true  Housing: Low Risk    Last Housing Risk Score: 0  Physical Activity: Sufficiently Active   Days of  Exercise per Week: 7 days   Minutes of Exercise per Session: 30 min  Social Connections: Socially Isolated   Frequency of Communication with Friends and Family: Once a week   Frequency of Social Gatherings with Friends and Family: Once a week   Attends Religious Services: Never   Marine scientist or Organizations: Yes   Attends Archivist Meetings: Never   Marital Status: Never married  Stress: No Stress Concern Present   Feeling of Stress : Not at all  Tobacco Use: Low Risk    Smoking Tobacco Use: Never   Smokeless Tobacco Use: Never  Transportation Needs:  No Transportation Needs   Lack of Transportation (Medical): No   Lack of Transportation (Non-Medical): No   Patient reports he has stopped taking all prescription medications and is only taking supplements and denies any problems with any of them. Patient's biggest concern is his stuffiness over the past year and this has not resolved. He has tried several different antihistamines and nasal sprays and none of them are effective enough and a lot of them dry him out. He is concerned that he has nasal polyps and would like a referral for a good ENT.  Patient reports he is a vegetarian and has been since 23. He has recently cut back on cheese some but still mostly eats what he wants. He has cut back on fried foods with using an air fryer and eats lots of vegetables.   CCM Care Plan  Allergies  Allergen Reactions   Penicillins     Medications Reviewed Today     Reviewed by Viona Gilmore, [facility] (Pharmacist) on 02/22/21 at 1205  Med List Status: <None>   Medication Order Taking? Sig Documenting Provider Last Dose Status Informant  ALFALFA PO [id number] Yes Take by mouth daily. 2.4 grams [provider] Taking Active   APPLE CIDER VINEGAR PO [id number] Yes Take by mouth daily. [provider] Taking Active   Cholecalciferol (VITAMIN D PO) [id number] Yes Take by mouth. [provider] Taking  Active   CITRUS BERGAMOT PO [id number] Yes Take 200 mg by mouth daily. [provider] Taking Active   ELDERBERRY PO [id number] Yes Take by mouth daily. [provider] Taking Active   fish oil-omega-3 fatty acids 1000 MG capsule [id number] Yes Take 2 g by mouth daily. [provider] Taking Active   Garlic 3567 MG CAPS [id number] Yes Take by mouth. [provider] Taking Active   Green Tea, Camellia sinensis, (GREEN TEA EXTRACT PO) [id number] Yes Take 1 capsule by mouth daily. Also known as EGCG [provider] Taking Active   MELATONIN PO [id number] Yes Take 1 mg by mouth at bedtime as needed. [provider] Taking Active   MILK THISTLE PO [id number] Yes Take by mouth daily. [provider] Taking Active   Multiple Vitamin (MULTIVITAMIN) tablet [id number] Yes Take 1 tablet by mouth daily. [provider] Taking Active   Probiotic Product (PROBIOTIC PO) [id number] Yes Take by mouth daily. [provider] Taking [place] [id number] Yes Take by mouth daily. [provider] Taking Active   sodium chloride (OCEAN) 0.65 % SOLN nasal spray [id number] Yes Place 1 spray into both nostrils as needed for congestion. [provider] Taking Active   UBIQUINOL PO [id number] Yes Take by mouth daily. [provider] Taking Active             Patient Active Problem List   Diagnosis Date Noted   Neuropathy 03/08/2020   Mild anxiety 01/05/2018   BPH with urinary obstruction 01/03/2017   Knee pain, left 04/21/2015   Retinal tear of right eye 04/21/2015   Hx of adenomatous colonic polyps 12/19/2010   Dyslipidemia 06/29/2008    Immunization History  Administered Date(s) Administered   Influenza, High Dose Seasonal PF 05/31/2017, 04/22/2019, 06/11/2020   Influenza,inj,Quad PF,6+ Mos 04/21/2015, 05/06/2016   Influenza-Unspecified 05/31/2017   PFIZER(Purple Top)SARS-COV-2 Vaccination 09/30/2019,  10/25/2019, 05/28/2020, 12/26/2020   Pneumococcal Conjugate-13 01/10/2014   Pneumococcal Polysaccharide-23 04/21/2015   Tdap 05/06/2016   Zoster Recombinat (Shingrix) 03/08/2019, 05/10/2019   Zoster, Live 01/08/2013  Conditions to be addressed/monitored:  Hyperlipidemia, BPH, Allergic Rhinitis, and Neuropathy and Insomnia  Care Plan : [place]  Updates made by Viona Gilmore, [place] since 02/22/2021 12:00 AM     Problem: Problem: Hyperlipidemia, BPH, Allergic Rhinitis, and Neuropathy and Insomnia      Long-Range Goal: Patient-Specific Goal   Start Date: 02/19/2021  Expected End Date: 02/19/2022  This Visit's Progress: On track  Priority: High  Note:   Current Barriers:  Unable to independently monitor therapeutic efficacy Unable to achieve control of cholesterol   Pharmacist Clinical Goal(s):  Patient will achieve adherence to monitoring guidelines and medication adherence to achieve therapeutic efficacy achieve control of cholesterol as evidenced by next lipid panel  through collaboration with PharmD and provider.   Interventions: 1:1 collaboration with Laurey Morale, MD regarding development and update of comprehensive plan of care as evidenced by provider attestation and co-signature Inter-disciplinary care team collaboration (see longitudinal plan of care) Comprehensive medication review performed; medication list updated in electronic medical record  Hyperlipidemia: (LDL goal < 100) -Uncontrolled -Current treatment: No medications -Medications previously tried: none  -Current dietary patterns: uses olive oil; recently switched to air frying more foods but does eat fried foods -Current exercise habits: did not discuss -Educated on Cholesterol goals;  Benefits of statin for ASCVD risk reduction; Importance of limiting foods high in cholesterol; Exercise goal of 150 minutes per week; -Counseled on diet and exercise extensively Recommended CAC scoring  and statin therapy. Patient will discuss with PCP at next office visit.  Allergic rhinitis/chronic sinusitis (Goal: minimize symptoms) -Not ideally controlled -Current treatment  Xlear rescue daily for nasal spray to help with congestion Saline spray before bedtime  -Medications previously tried: Flonase and antihistamines (depression and fatigue/zombie), loratadine (dries him out) - Patient request a referral for an ENT.  Insomnia (Goal: improve quality and quantity of sleep) -Controlled -Current treatment  Melatonin 1 mg 1 tablet at bedtime  -Medications previously tried: trazodone  -Counseled on practicing good sleep hygiene by setting a sleep schedule and maintaining it, avoid excessive napping, following a nightly routine, avoiding screen time for 30-60 minutes before going to bed, and making the bedroom a cool, quiet and dark space  Health Maintenance -Vaccine gaps: none -Current therapy:  Garlic 7517 mg 1 capsule daily Multivitamin 1 tablet daily Probiotic daily Green tea extract Alfalfa daily Vitamin D daily  200 mg of bergamot  Ubiquinol daily Milk thistle daily Apple cider vinegar 1 pills twice daily  Royal jelly capsule  Elderberry extract daily  -Educated on Herbal supplement research is limited and benefits usually cannot be proven Cost vs benefit of each product must be carefully weighed by individual consumer -Patient is satisfied with current therapy and denies issues -Recommended to continue current medication  Patient Goals/Self-Care Activities Patient will:  - target a minimum of 150 minutes of moderate intensity exercise weekly engage in dietary modifications by limiting fried foods and increasing daily fiber intake  Follow Up Plan: Face to Face appointment with care management team member scheduled for: 1 year      Medication Assistance: None required.  Patient affirms current coverage meets needs.  Compliance/Adherence/Medication fill  history: Care Gaps: None  Star-Rating Drugs: None  Patient's preferred pharmacy is:  [id number] [place], [id number]: [phone]: [phone] Uses pill box? No - not taking everything daily Pt endorses 99% compliance  We discussed: Current pharmacy  is preferred with insurance plan and patient is satisfied with pharmacy services Patient decided to: Continue current medication management strategy  Care Plan and Follow Up Patient Decision:  Patient agrees to Care Plan and Follow-up.  Plan: Face to Face appointment with care management team member scheduled for: 1 year  Jeni Salles, PharmD, [place] Pharmacist [employer] at [place] [phone]

## 2021-02-22 NOTE — Patient Instructions (Addendum)
Hi Christian Martin,  It was great to get to meet you in person! Below is a summary of some of the topics we discussed.  I also attached some more information about working on dietary changes to lower your cholesterol. Don't forget to discuss further when you see Dr. Sarajane Jews in July.  Please reach out to me if you have any questions or need anything before our follow up!  Best, Maddie  Jeni Salles, PharmD, [place] [facility] [place]   Visit Information   Goals Addressed   None    Patient Care Plan: CCM Pharmacy Care Plan     Problem Identified: Problem: Hyperlipidemia, BPH, Allergic Rhinitis, and Neuropathy and Insomnia      Long-Range Goal: Patient-Specific Goal   Start Date: 02/19/2021  Expected End Date: 02/19/2022  This Visit's Progress: On track  Priority: High  Note:   Current Barriers:  Unable to independently monitor therapeutic efficacy Unable to achieve control of cholesterol   Pharmacist Clinical Goal(s):  Patient will achieve adherence to monitoring guidelines and medication adherence to achieve therapeutic efficacy achieve control of cholesterol as evidenced by next lipid panel  through collaboration with PharmD and provider.   Interventions: 1:1 collaboration with Laurey Morale, MD regarding development and update of comprehensive plan of care as evidenced by provider attestation and co-signature Inter-disciplinary care team collaboration (see longitudinal plan of care) Comprehensive medication review performed; medication list updated in electronic medical record  Hyperlipidemia: (LDL goal < 100) -Uncontrolled -Current treatment: No medications -Medications previously tried: none  -Current dietary patterns: uses olive oil; recently switched to air frying more foods but does eat fried foods -Current exercise habits: did not discuss -Educated on Cholesterol goals;  Benefits of statin for ASCVD risk reduction; Importance  of limiting foods high in cholesterol; Exercise goal of 150 minutes per week; -Counseled on diet and exercise extensively Recommended CAC scoring and statin therapy. Patient will discuss with PCP at next office visit.  Allergic rhinitis/chronic sinusitis (Goal: minimize symptoms) -Not ideally controlled -Current treatment  Xlear rescue daily for nasal spray to help with congestion Saline spray before bedtime  -Medications previously tried: Flonase and antihistamines (depression and fatigue/zombie), loratadine (dries him out) - Patient request a referral for an ENT.  Insomnia (Goal: improve quality and quantity of sleep) -Controlled -Current treatment  Melatonin 1 mg 1 tablet at bedtime  -Medications previously tried: trazodone  -Counseled on practicing good sleep hygiene by setting a sleep schedule and maintaining it, avoid excessive napping, following a nightly routine, avoiding screen time for 30-60 minutes before going to bed, and making the bedroom a cool, quiet and dark space  Health Maintenance -Vaccine gaps: none -Current therapy:  Garlic 0814 mg 1 capsule daily Multivitamin 1 tablet daily Probiotic daily Green tea extract Alfalfa daily Vitamin D daily  200 mg of bergamot  Ubiquinol daily Milk thistle daily Apple cider vinegar 1 pills twice daily  Royal jelly capsule  Elderberry extract daily  -Educated on Herbal supplement research is limited and benefits usually cannot be proven Cost vs benefit of each product must be carefully weighed by individual consumer -Patient is satisfied with current therapy and denies issues -Recommended to continue current medication  Patient Goals/Self-Care Activities Patient will:  - target a minimum of 150 minutes of moderate intensity exercise weekly engage in dietary modifications by limiting fried foods and increasing daily fiber intake  Follow Up Plan: Face to Face appointment with care management team member scheduled for: 1  year      Mr. Christian Martin was given information about Chronic Care Management services today including:  CCM service includes personalized support from designated clinical staff supervised by his physician, including individualized plan of care and coordination with other care providers 24/7 contact phone numbers for assistance for urgent and routine care needs. Standard insurance, coinsurance, copays and deductibles apply for chronic care management only during months in which we provide at least 20 minutes of these services. Most insurances cover these services at 100%, however patients may be responsible for any copay, coinsurance and/or deductible if applicable. This service may help you avoid the need for more expensive face-to-face services. Only one practitioner may furnish and bill the service in a calendar month. The patient may stop CCM services at any time (effective at the end of the month) by phone call to the office staff.  Patient agreed to services and verbal consent obtained.   The patient verbalized understanding of instructions, educational materials, and care plan provided today and agreed to receive a mailed copy of patient instructions, educational materials, and care plan.  Telephone follow up appointment with pharmacy team member scheduled for: 1 year  Viona Gilmore, [facility]

## 2021-02-28 ENCOUNTER — Telehealth: Payer: Self-pay | Admitting: Family Medicine

## 2021-02-28 DIAGNOSIS — J339 Nasal polyp, unspecified: Secondary | ICD-10-CM

## 2021-02-28 NOTE — Telephone Encounter (Signed)
-----  Message from Viona Gilmore, [facility] sent at 02/22/2021 12:19 PM EDT ----- Regarding: ENT referral Hi,  I met with Mr. Falero this week and was wondering if you could place an ENT referral for him, as he is concerned about nasal polyps and is still having the same issue for the last year. He specifically requested a "good ENT" but mainly if you know of any that do not push surgery.  Thank you! Maddie

## 2021-02-28 NOTE — Telephone Encounter (Signed)
The referral was done  

## 2021-03-09 ENCOUNTER — Encounter: Payer: Self-pay | Admitting: Family Medicine

## 2021-03-09 ENCOUNTER — Other Ambulatory Visit: Payer: Self-pay

## 2021-03-09 ENCOUNTER — Ambulatory Visit (INDEPENDENT_AMBULATORY_CARE_PROVIDER_SITE_OTHER): Payer: Medicare HMO | Admitting: Family Medicine

## 2021-03-09 VITALS — BP 110/80 | HR 89 | Temp 99.0°F | Ht 69.5 in | Wt 161.2 lb

## 2021-03-09 DIAGNOSIS — Z0000 Encounter for general adult medical examination without abnormal findings: Secondary | ICD-10-CM | POA: Diagnosis not present

## 2021-03-09 LAB — CBC WITH DIFFERENTIAL/PLATELET
Basophils Absolute: 0.1 10*3/uL (ref 0.0–0.1)
Basophils Relative: 0.6 % (ref 0.0–3.0)
Eosinophils Absolute: 0 10*3/uL (ref 0.0–0.7)
Eosinophils Relative: 0.5 % (ref 0.0–5.0)
HCT: 42.5 % (ref 39.0–52.0)
Hemoglobin: 14.7 g/dL (ref 13.0–17.0)
Lymphocytes Relative: 19.9 % (ref 12.0–46.0)
Lymphs Abs: 1.7 10*3/uL (ref 0.7–4.0)
MCHC: 34.5 g/dL (ref 30.0–36.0)
MCV: 92 fl (ref 78.0–100.0)
Monocytes Absolute: 0.6 10*3/uL (ref 0.1–1.0)
Monocytes Relative: 7.1 % (ref 3.0–12.0)
Neutro Abs: 6 10*3/uL (ref 1.4–7.7)
Neutrophils Relative %: 71.9 % (ref 43.0–77.0)
Platelets: 234 10*3/uL (ref 150.0–400.0)
RBC: 4.62 Mil/uL (ref 4.22–5.81)
RDW: 12.4 % (ref 11.5–15.5)
WBC: 8.3 10*3/uL (ref 4.0–10.5)

## 2021-03-09 LAB — LIPID PANEL
Cholesterol: 193 mg/dL (ref 0–200)
HDL: 50.3 mg/dL (ref >39.00)
LDL Cholesterol: 120 mg/dL — ABNORMAL HIGH (ref 0–99)
NonHDL: 142.21
Total CHOL/HDL Ratio: 4
Triglycerides: 112 mg/dL (ref 0.0–149.0)
VLDL: 22.4 mg/dL (ref 0.0–40.0)

## 2021-03-09 LAB — BASIC METABOLIC PANEL
BUN: 19 mg/dL (ref 6–23)
CO2: 26 mEq/L (ref 19–32)
Calcium: 9 mg/dL (ref 8.4–10.5)
Chloride: 103 mEq/L (ref 96–112)
Creatinine, Ser: 1.15 mg/dL (ref 0.40–1.50)
GFR: 63.72 mL/min (ref >60.00)
Glucose, Bld: 87 mg/dL (ref 70–99)
Potassium: 4.3 mEq/L (ref 3.5–5.1)
Sodium: 140 mEq/L (ref 135–145)

## 2021-03-09 LAB — T4, FREE: Free T4: 0.95 ng/dL (ref 0.60–1.60)

## 2021-03-09 LAB — HEPATIC FUNCTION PANEL
ALT: 14 U/L (ref 0–53)
AST: 18 U/L (ref 0–37)
Albumin: 4.4 g/dL (ref 3.5–5.2)
Alkaline Phosphatase: 75 U/L (ref 39–117)
Bilirubin, Direct: 0.2 mg/dL (ref 0.0–0.3)
Total Bilirubin: 0.9 mg/dL (ref 0.2–1.2)
Total Protein: 6.8 g/dL (ref 6.0–8.3)

## 2021-03-09 LAB — PSA: PSA: 0.83 ng/mL (ref 0.10–4.00)

## 2021-03-09 LAB — TSH: TSH: 2.14 u[IU]/mL (ref 0.35–5.50)

## 2021-03-09 LAB — HEMOGLOBIN A1C: Hgb A1c MFr Bld: 5.6 % (ref 4.6–6.5)

## 2021-03-09 LAB — T3, FREE: T3, Free: 3.2 pg/mL (ref 2.3–4.2)

## 2021-03-09 NOTE — Addendum Note (Signed)
Addended by: Amanda Cockayne on: 03/09/2021 09:35 AM   Modules accepted: Orders

## 2021-03-09 NOTE — Progress Notes (Signed)
   Subjective:    Patient ID: Christian Martin, male    DOB: Feb 04, 1951, 70 y.o.   MRN: [id number]  HPI Here for a well exam. He feels well in general. He swims 3 days a week.    Review of Systems  Constitutional: Negative.   HENT: Negative.    Eyes: Negative.   Respiratory: Negative.    Cardiovascular: Negative.   Gastrointestinal: Negative.   Genitourinary: Negative.   Musculoskeletal: Negative.   Skin: Negative.   Neurological: Negative.   Psychiatric/Behavioral: Negative.        Objective:   Physical Exam Constitutional:      General: He is not in acute distress.    Appearance: Normal appearance. He is well-developed. He is not diaphoretic.  HENT:     Head: Normocephalic and atraumatic.     Right Ear: External ear normal.     Left Ear: External ear normal.     Nose: Nose normal.     Mouth/Throat:     Pharynx: No oropharyngeal exudate.  Eyes:     General: No scleral icterus.       Right eye: No discharge.        Left eye: No discharge.     Conjunctiva/sclera: Conjunctivae normal.     Pupils: Pupils are equal, round, and reactive to light.  Neck:     Thyroid: No thyromegaly.     Vascular: No JVD.     Trachea: No tracheal deviation.  Cardiovascular:     Rate and Rhythm: Normal rate and regular rhythm.     Heart sounds: Normal heart sounds. No murmur heard.   No friction rub. No gallop.  Pulmonary:     Effort: Pulmonary effort is normal. No respiratory distress.     Breath sounds: Normal breath sounds. No wheezing or rales.  Chest:     Chest wall: No tenderness.  Abdominal:     General: Bowel sounds are normal. There is no distension.     Palpations: Abdomen is soft. There is no mass.     Tenderness: There is no abdominal tenderness. There is no guarding or rebound.  Genitourinary:    Penis: Normal. No tenderness.      Testes: Normal.     Prostate: Normal.     Rectum: Normal. Guaiac result negative.  Musculoskeletal:        General: No tenderness. Normal  range of motion.     Cervical back: Neck supple.  Lymphadenopathy:     Cervical: No cervical adenopathy.  Skin:    General: Skin is warm and dry.     Coloration: Skin is not pale.     Findings: No erythema or rash.  Neurological:     Mental Status: He is alert and oriented to person, place, and time.     Cranial Nerves: No cranial nerve deficit.     Motor: No abnormal muscle tone.     Coordination: Coordination normal.     Deep Tendon Reflexes: Reflexes are normal and symmetric. Reflexes normal.  Psychiatric:        Behavior: Behavior normal.        Thought Content: Thought content normal.        Judgment: Judgment normal.          Assessment & Plan:  Well exam. We discussed diet and exercise. Get fasting labs. Per his request, we will set up a cardiac CT calcium test.  Alysia Penna, MD

## 2021-04-03 ENCOUNTER — Other Ambulatory Visit: Payer: Self-pay

## 2021-04-03 ENCOUNTER — Ambulatory Visit (INDEPENDENT_AMBULATORY_CARE_PROVIDER_SITE_OTHER)
Admission: RE | Admit: 2021-04-03 | Discharge: 2021-04-03 | Disposition: A | Discharge status: Home / Self Care | Payer: Self-pay | Source: Ambulatory Visit | Attending: Family Medicine | Admitting: Family Medicine

## 2021-04-03 DIAGNOSIS — Z0000 Encounter for general adult medical examination without abnormal findings: Secondary | ICD-10-CM

## 2021-04-04 ENCOUNTER — Encounter: Payer: Self-pay | Admitting: Family Medicine

## 2021-04-26 ENCOUNTER — Other Ambulatory Visit: Payer: Self-pay

## 2021-04-26 ENCOUNTER — Ambulatory Visit (INDEPENDENT_AMBULATORY_CARE_PROVIDER_SITE_OTHER): Payer: Medicare HMO | Admitting: Otolaryngology

## 2021-04-26 DIAGNOSIS — J342 Deviated nasal septum: Secondary | ICD-10-CM

## 2021-04-26 DIAGNOSIS — J3489 Other specified disorders of nose and nasal sinuses: Secondary | ICD-10-CM | POA: Diagnosis not present

## 2021-04-26 DIAGNOSIS — J310 Chronic rhinitis: Secondary | ICD-10-CM

## 2021-04-26 NOTE — Progress Notes (Signed)
HPI: Christian Martin is a 70 y.o. male who presents is referred by his PCP of for evaluation of intermittent nasal obstruction that occurs mostly at night when sleeping.  He does not have much trouble breathing through his nose during the day.  He has no pain or pressure in the head.  But at night when he tries to sleep occasionally 1 side will become obstructed.  He occasionally uses decongestant spray Afrin but no more than once a week.  He has tried nasal steroid sprays but does not seem to tolerate Flonase well.  He is presently using Nasacort.  He also uses saline rinse when the nose is obstructed..  Past Medical History:  Diagnosis Date   Hx of adenomatous colonic polyps    Hyperlipidemia    Retinal tear of right eye 2016   saw Dr. Prudencio Burly    Past Surgical History:  Procedure Laterality Date   COLONOSCOPY  11/09/2019   per Dr. Havery Moros, adenomatous polyp, repeat in 7 yrs    LACERATION REPAIR     Please update my chart: 01-18-18 Lacerated forehead wound 1.5" treated with 6 sutures at [facility] on [address].    WISDOM TOOTH EXTRACTION     Social History   Socioeconomic History   Marital status: Single    Spouse name: Not on file   Number of children: Not on file   Years of education: Not on file   Highest education level: Not on file  Occupational History   Not on file  Tobacco Use   Smoking status: Never   Smokeless tobacco: Never  Substance and Sexual Activity   Alcohol use: Not Currently    Alcohol/week: 0.0 standard drinks    Comment: glass of wine each day   Drug use: No   Sexual activity: Not on file  Other Topics Concern   Not on file  Social History Narrative   Not on file   Social Determinants of Health   Financial Resource Strain: Low Risk    Difficulty of Paying Living Expenses: Not hard at all  Food Insecurity: No Food Insecurity   Worried About Charity fundraiser in the Last Year: Never true   [place] in the Last Year: Never true  Transportation  Needs: No Transportation Needs   Lack of Transportation (Medical): No   Lack of Transportation (Non-Medical): No  Physical Activity: Sufficiently Active   Days of Exercise per Week: 7 days   Minutes of Exercise per Session: 30 min  Stress: No Stress Concern Present   Feeling of Stress : Not at all  Social Connections: Socially Isolated   Frequency of Communication with Friends and Family: Once a week   Frequency of Social Gatherings with Friends and Family: Once a week   Attends Religious Services: Never   Marine scientist or Organizations: Yes   Attends Archivist Meetings: Never   Marital Status: Never married   Family History  Problem Relation Age of Onset   Cancer Sister        breast   Osteoporosis Mother    Hypertension Mother    Heart failure Father    Breast cancer Other    Coronary artery disease Other    Colon cancer Neg Hx    Esophageal cancer Neg Hx    Rectal cancer Neg Hx    Stomach cancer Neg Hx    Allergies  Allergen Reactions   Penicillins    Prior to Admission  medications   Medication Sig Start Date End Date Taking? Authorizing Provider  ALFALFA PO Take by mouth daily. 2.4 grams    [provider]  APPLE CIDER VINEGAR PO Take by mouth daily.    [provider]  Cholecalciferol (VITAMIN D PO) Take by mouth.    [provider]  CITRUS BERGAMOT PO Take 200 mg by mouth daily.    [provider]  ELDERBERRY PO Take by mouth daily.    [provider]  fish oil-omega-3 fatty acids 1000 MG capsule Take 2 g by mouth daily.    [provider]  Garlic 123XX123 MG CAPS Take by mouth.    [provider]  Nyoka Cowden Tea, Camellia sinensis, (GREEN TEA EXTRACT PO) Take 1 capsule by mouth daily. Also known as EGCG    [provider]  MELATONIN PO Take 1 mg by mouth at bedtime as needed. Patient not taking: Reported on 03/09/2021    [provider]  MILK THISTLE PO Take by mouth daily.     [provider]  Multiple Vitamin (MULTIVITAMIN) tablet Take 1 tablet by mouth daily.    [provider]  Probiotic Product (PROBIOTIC PO) Take by mouth daily.    [provider]  ROYAL JELLY PO Take by mouth daily.    [provider]  sodium chloride (OCEAN) 0.65 % SOLN nasal spray Place 1 spray into both nostrils as needed for congestion.    [provider]  UBIQUINOL PO Take by mouth daily.    [provider]     Positive ROS: Otherwise negative  All other systems have been reviewed and were otherwise negative with the exception of those mentioned in the HPI and as above.  Physical Exam: Constitutional: Alert, well-appearing, no acute distress Ears: External ears without lesions or tenderness. Ear canals are clear bilaterally with intact, clear TMs.  Nasal: External nose without lesions. Septum is anteriorly bowed to the left but more posteriorly the bony septum is more bowed to the right.  On nasopharyngoscopy nasal passages otherwise clear today both middle meatus regions are clear.  The nasopharynx is clear.  No polyps noted.  He has mild rhinitis.  He has prominent inferior turbinates but no abnormalities noted otherwise. Clear nasal passages on anterior rhinoscopy in the office today. Oral: Lips and gums without lesions. Tongue and palate mucosa without lesions. Posterior oropharynx clear.  He has elongated uvula. Neck: No palpable adenopathy or masses Respiratory: Breathing comfortably  Skin: No facial/neck lesions or rash noted.  Procedures  Assessment: Chronic rhinitis with mild to moderate septal deviation.  Plan: Reviewed with him concerning medical treatment of this which would consist of regular use of Nasacort 1 to 2 sprays each nostril at night when he goes to bed. Suggested using saline rinse which she has been using as needed.  He can add the Afrin to the saline rinse but no more than once or twice a week.  As  this can be addictive which he is well aware of. He is not really interested in any kind of surgical intervention. Reviewed with him concerning my medical recommendations which he is presently doing but would recommend regular use of Nasacort spray. No intranasal polyps noted on exam in the office today   Radene Journey, MD   CC:

## 2021-05-07 ENCOUNTER — Encounter: Payer: Self-pay | Admitting: Family Medicine

## 2021-05-10 ENCOUNTER — Encounter: Payer: Self-pay | Admitting: Family Medicine

## 2021-05-11 NOTE — Telephone Encounter (Signed)
I agree, this sounds questionable to me

## 2021-05-11 NOTE — Telephone Encounter (Signed)
I have never heard of the Vivaire treatment. I suppose he can try it if he wants to

## 2021-05-28 ENCOUNTER — Encounter: Payer: Self-pay | Admitting: Family Medicine

## 2021-05-28 NOTE — Telephone Encounter (Signed)
I am glad he is feeling better. You can remind him that it is impossible to get the flu from a flu shot, so he either had prolonged side effects from it or else he had some other unrelated virus

## 2021-05-29 NOTE — Telephone Encounter (Signed)
Set up a virtual OV today so we can take care of things

## 2021-05-30 ENCOUNTER — Other Ambulatory Visit: Payer: Self-pay

## 2021-05-30 ENCOUNTER — Encounter: Payer: Self-pay | Admitting: Family Medicine

## 2021-05-30 ENCOUNTER — Ambulatory Visit (INDEPENDENT_AMBULATORY_CARE_PROVIDER_SITE_OTHER): Payer: Medicare HMO | Admitting: Family Medicine

## 2021-05-30 VITALS — BP 120/80 | HR 72 | Temp 98.5°F | Wt 162.1 lb

## 2021-05-30 DIAGNOSIS — J069 Acute upper respiratory infection, unspecified: Secondary | ICD-10-CM

## 2021-05-30 MED ORDER — METHYLPREDNISOLONE ACETATE 40 MG/ML IJ SUSP
40.0000 mg | Freq: Once | INTRAMUSCULAR | Status: AC
  Administered 2021-05-30: 40 mg via INTRAMUSCULAR

## 2021-05-30 MED ORDER — METHYLPREDNISOLONE ACETATE 80 MG/ML IJ SUSP
80.0000 mg | Freq: Once | INTRAMUSCULAR | Status: AC
  Administered 2021-05-30: 80 mg via INTRAMUSCULAR

## 2021-05-30 NOTE — Addendum Note (Signed)
Addended by: Gwenyth Ober R on: 05/30/2021 03:27 PM   Modules accepted: Orders

## 2021-05-30 NOTE — Patient Instructions (Signed)
There are no preventive care reminders to display for this patient.  Depression screen [facility] 2/9 03/09/2021 09/12/2020 06/21/2016  Decreased Interest 0 0 0  Down, Depressed, Hopeless 1 0 0  PHQ - 2 Score 1 0 0  Altered sleeping 3 - -  Tired, decreased energy 1 - -  Change in appetite 0 - -  Feeling bad or failure about yourself  0 - -  Trouble concentrating 0 - -  Moving slowly or fidgety/restless 0 - -  Suicidal thoughts 0 - -  PHQ-9 Score 5 - -  Difficult doing work/chores Not difficult at all - -

## 2021-05-30 NOTE — Progress Notes (Signed)
   Subjective:    Patient ID: Christian Martin, male    DOB: 1951-06-30, 70 y.o.   MRN: [id number]  HPI Here for one week of stuffy head, nasal congestion, and PND. This all started the day after he got a flu shot at his pharmacy last week. For the first few days he also had body aches and NVD. No fever or ST or cough or SOB. He tested negative for the Covid-19 virus last weekend. Now for the past 2 days the aches and NVD have resolved, but the head and nasal congestion remain. He is using Nasacort and Afrin sprays.    Review of Systems  Constitutional: Negative.   HENT:  Positive for congestion, postnasal drip and sinus pressure. Negative for ear pain and sore throat.   Eyes: Negative.   Respiratory: Negative.        Objective:   Physical Exam Constitutional:      Appearance: Normal appearance. He is not ill-appearing.  HENT:     Right Ear: Tympanic membrane, ear canal and external ear normal.     Left Ear: Tympanic membrane, ear canal and external ear normal.     Nose: Nose normal.     Mouth/Throat:     Pharynx: Oropharynx is clear.  Eyes:     Conjunctiva/sclera: Conjunctivae normal.  Pulmonary:     Effort: Pulmonary effort is normal.     Breath sounds: Normal breath sounds.  Lymphadenopathy:     Cervical: No cervical adenopathy.  Neurological:     Mental Status: He is alert.          Assessment & Plan:  Viral URI. We will give him a shot of DepoMedrol to reduce inflammation. Recheck as needed.  Alysia Penna, MD

## 2021-06-04 ENCOUNTER — Encounter: Payer: Self-pay | Admitting: Family Medicine

## 2021-06-05 NOTE — Telephone Encounter (Signed)
It sounds like the steroid shot has finally worn off. As for using Afrin at night, I think it would be fine to keep using this as long as it is only once a day

## 2021-06-08 ENCOUNTER — Encounter: Payer: Self-pay | Admitting: Family Medicine

## 2021-06-08 NOTE — Telephone Encounter (Signed)
FYI

## 2021-06-08 NOTE — Telephone Encounter (Signed)
We can do this if he wishes. He had comprehensive lab work done just 3 months ago, however, and these were fine. I think most of his symptoms may be from lack of sleep. He can certainly take a medication to help with sleep, at least for awhile

## 2021-06-10 ENCOUNTER — Encounter: Payer: Self-pay | Admitting: Family Medicine

## 2021-06-11 ENCOUNTER — Encounter: Payer: Self-pay | Admitting: Family Medicine

## 2021-06-11 ENCOUNTER — Other Ambulatory Visit: Payer: Self-pay

## 2021-06-11 ENCOUNTER — Ambulatory Visit (INDEPENDENT_AMBULATORY_CARE_PROVIDER_SITE_OTHER): Payer: Medicare HMO | Admitting: Family Medicine

## 2021-06-11 VITALS — BP 118/80 | HR 67 | Temp 98.4°F | Wt 156.4 lb

## 2021-06-11 DIAGNOSIS — R110 Nausea: Secondary | ICD-10-CM | POA: Diagnosis not present

## 2021-06-11 DIAGNOSIS — G4700 Insomnia, unspecified: Secondary | ICD-10-CM

## 2021-06-11 LAB — BASIC METABOLIC PANEL
BUN: 14 mg/dL (ref 6–23)
CO2: 26 mEq/L (ref 19–32)
Calcium: 9.2 mg/dL (ref 8.4–10.5)
Chloride: 103 mEq/L (ref 96–112)
Creatinine, Ser: 1 mg/dL (ref 0.40–1.50)
GFR: 75.21 mL/min (ref >60.00)
Glucose, Bld: 91 mg/dL (ref 70–99)
Potassium: 4.3 mEq/L (ref 3.5–5.1)
Sodium: 137 mEq/L (ref 135–145)

## 2021-06-11 LAB — HEPATIC FUNCTION PANEL
ALT: 15 U/L (ref 0–53)
AST: 17 U/L (ref 0–37)
Albumin: 4.4 g/dL (ref 3.5–5.2)
Alkaline Phosphatase: 68 U/L (ref 39–117)
Bilirubin, Direct: 0.2 mg/dL (ref 0.0–0.3)
Total Bilirubin: 1.1 mg/dL (ref 0.2–1.2)
Total Protein: 6.8 g/dL (ref 6.0–8.3)

## 2021-06-11 LAB — CBC WITH DIFFERENTIAL/PLATELET
Basophils Absolute: 0 10*3/uL (ref 0.0–0.1)
Basophils Relative: 0.6 % (ref 0.0–3.0)
Eosinophils Absolute: 0 10*3/uL (ref 0.0–0.7)
Eosinophils Relative: 0.3 % (ref 0.0–5.0)
HCT: 43.7 % (ref 39.0–52.0)
Hemoglobin: 14.5 g/dL (ref 13.0–17.0)
Lymphocytes Relative: 28.4 % (ref 12.0–46.0)
Lymphs Abs: 1.8 10*3/uL (ref 0.7–4.0)
MCHC: 33.3 g/dL (ref 30.0–36.0)
MCV: 92.3 fl (ref 78.0–100.0)
Monocytes Absolute: 0.4 10*3/uL (ref 0.1–1.0)
Monocytes Relative: 6.1 % (ref 3.0–12.0)
Neutro Abs: 4.2 10*3/uL (ref 1.4–7.7)
Neutrophils Relative %: 64.6 % (ref 43.0–77.0)
Platelets: 229 10*3/uL (ref 150.0–400.0)
RBC: 4.73 Mil/uL (ref 4.22–5.81)
RDW: 13.2 % (ref 11.5–15.5)
WBC: 6.5 10*3/uL (ref 4.0–10.5)

## 2021-06-11 LAB — AMYLASE: Amylase: 35 U/L (ref 27–131)

## 2021-06-11 LAB — LIPASE: Lipase: 39 U/L (ref 11.0–59.0)

## 2021-06-11 MED ORDER — TEMAZEPAM 7.5 MG PO CAPS: 7.5000 mg | ORAL_CAPSULE | Freq: Every evening | ORAL | 2 refills | Status: DC | PRN

## 2021-06-11 MED ORDER — OMEPRAZOLE 40 MG PO CPDR: 40.0000 mg | DELAYED_RELEASE_CAPSULE | Freq: Every day | ORAL | 3 refills | Status: DC

## 2021-06-11 MED ORDER — ONDANSETRON HCL 8 MG PO TABS: 8.0000 mg | ORAL_TABLET | Freq: Four times a day (QID) | ORAL | 0 refills | Status: DC | PRN

## 2021-06-11 NOTE — Progress Notes (Signed)
   Subjective:    Patient ID: Christian Martin, male    DOB: Nov 22, 1950, 70 y.o.   MRN: [id number]  HPI Here for several issues. First he has great trouble sleeping, both with initiating and with maintaining sleep. He had been trying Trazodone, but this caused many side effects such as nausea and a hung over feeling. Also for the past 3 weeks he has dealt with a lot of nausea without vomiting. No abdominal pain. His stools are the same soft consistency as usual, but the color is a bit darker than usual. He also feels very fatigued. No fever or body aches. No headache or ST or cough. No SOB. He tested negative for the Covid-19 virus a few days ago.    Review of Systems  Constitutional:  Positive for appetite change and fatigue. Negative for chills and fever.  HENT:  Positive for congestion. Negative for postnasal drip, sinus pain and sore throat.   Eyes: Negative.   Respiratory: Negative.    Cardiovascular: Negative.   Gastrointestinal:  Positive for nausea. Negative for abdominal distention, abdominal pain, anal bleeding, blood in stool, constipation, diarrhea and vomiting.  Genitourinary: Negative.       Objective:   Physical Exam Constitutional:      Comments: He looks very tired  Cardiovascular:     Rate and Rhythm: Normal rate and regular rhythm.     Pulses: Normal pulses.     Heart sounds: Normal heart sounds.  Pulmonary:     Effort: Pulmonary effort is normal.     Breath sounds: Normal breath sounds.  Abdominal:     General: Abdomen is flat. Bowel sounds are normal. There is no distension.     Palpations: Abdomen is soft. There is no mass.     Tenderness: There is no abdominal tenderness. There is no guarding or rebound.     Hernia: No hernia is present.  Lymphadenopathy:     Cervical: No cervical adenopathy.  Neurological:     General: No focal deficit present.     Mental Status: He is alert and oriented to person, place, and time.          Assessment & Plan:  The  etiology of his fatigue and nausea is not clear. One possibility would be gastritis or PUD. He will start on Omeprazole 40 mg every morning. He can use Zofran as needed for nausea. We will get labs today to investigate further. For insomnia we will try as low a dose of medication as we can, so he will try Temazepam 7.5 mg at bedtime. We spent a total of (33   ) minutes reviewing records and discussing these issues.  Alysia Penna, MD

## 2021-06-12 ENCOUNTER — Encounter: Payer: Self-pay | Admitting: Family Medicine

## 2021-06-12 ENCOUNTER — Ambulatory Visit: Payer: Medicare HMO | Admitting: Family Medicine

## 2021-06-12 NOTE — Telephone Encounter (Signed)
Noted. We await the abdominal US

## 2021-06-12 NOTE — Telephone Encounter (Signed)
Pt had office appointment yesterday, please advise

## 2021-06-13 ENCOUNTER — Encounter: Payer: Self-pay | Admitting: Family Medicine

## 2021-06-14 ENCOUNTER — Encounter: Payer: Self-pay | Admitting: Family Medicine

## 2021-06-14 ENCOUNTER — Ambulatory Visit
Admission: RE | Admit: 2021-06-14 | Discharge: 2021-06-14 | Disposition: A | Discharge status: Home / Self Care | Payer: Medicare HMO | Source: Ambulatory Visit | Attending: Family Medicine | Admitting: Family Medicine

## 2021-06-14 DIAGNOSIS — R110 Nausea: Secondary | ICD-10-CM

## 2021-06-14 DIAGNOSIS — K7689 Other specified diseases of liver: Secondary | ICD-10-CM | POA: Diagnosis not present

## 2021-06-14 DIAGNOSIS — N281 Cyst of kidney, acquired: Secondary | ICD-10-CM | POA: Diagnosis not present

## 2021-06-15 NOTE — Telephone Encounter (Signed)
He may want to try "Calm" which is an OTC magnesium-based product. I have heard good things about this

## 2021-06-18 NOTE — Telephone Encounter (Signed)
We actually see this "fatty liver" frequently when we do imaging. As long as the liver enzymes are normal (which his are) this should not be a problem. As for the acid blocker, I would take it for a total of 6 weeks and then stop it

## 2021-06-19 NOTE — Telephone Encounter (Signed)
The only other thing I can suggest I seeing the [facility]. I can refer him if he wishes

## 2021-06-25 ENCOUNTER — Other Ambulatory Visit: Payer: Medicare HMO

## 2021-07-27 ENCOUNTER — Telehealth: Payer: Self-pay | Admitting: Pharmacist

## 2021-07-27 NOTE — Chronic Care Management (AMB) (Signed)
Chronic Care Management Pharmacy Assistant   Name: Christian Martin  MRN: [id number] DOB: 25-Dec-1950    Reason for Encounter: Disease State HLD Assessent   Conditions to be addressed/monitored: HLD  Recent office visits:  06/11/21 - Laurey Morale, MD - Patient presented for Nausea and other concerns. Prescribed Omeprazole 40mg , Ondansetron HCL 8 mg, Temazepam 7.5 mg and Stopped Multivitamin.   10/5/22Laurey Morale, MD - Patient presented for Viral URI. DepoMedrol administered. No other medication changes.   03/09/21 Laurey Morale, MD - Patient presented for Preventative health care. No medication changes.  Recent consult visits:  04/26/21 Rozetta Nunnery, MD (Otolaryngology) - Patient presented for Chronic rhinitis and other concerns. No medication changes.   Hospital visits:  None in previous 6 months  Medications: Outpatient Encounter Medications as of 07/27/2021  Medication Sig   ALFALFA PO Take by mouth daily. 2.4 grams (Patient not taking: Reported on 06/11/2021)   APPLE CIDER VINEGAR PO Take by mouth daily. (Patient not taking: Reported on 06/11/2021)   Cholecalciferol (VITAMIN D PO) Take by mouth. (Patient not taking: Reported on 06/11/2021)   CITRUS BERGAMOT PO Take 200 mg by mouth daily. (Patient not taking: Reported on 06/11/2021)   ELDERBERRY PO Take by mouth daily. (Patient not taking: Reported on 06/11/2021)   fish oil-omega-3 fatty acids 1000 MG capsule Take 2 g by mouth daily. (Patient not taking: Reported on 02/19/9484)   Garlic 4627 MG CAPS Take by mouth. (Patient not taking: Reported on 06/11/2021)   Green Tea, Camellia sinensis, (GREEN TEA EXTRACT PO) Take 1 capsule by mouth daily. Also known as EGCG (Patient not taking: Reported on 06/11/2021)   MELATONIN PO Take 1 mg by mouth at bedtime as needed.   MILK THISTLE PO Take by mouth daily. (Patient not taking: Reported on 06/11/2021)   omeprazole (PRILOSEC) 40 MG capsule Take 1 capsule (40 mg total)  by mouth daily.   ondansetron (ZOFRAN) 8 MG tablet Take 1 tablet (8 mg total) by mouth every 6 (six) hours as needed for nausea or vomiting.   Probiotic Product (PROBIOTIC PO) Take by mouth daily. (Patient not taking: Reported on 06/11/2021)   ROYAL JELLY PO Take by mouth daily. (Patient not taking: Reported on 06/11/2021)   sodium chloride (OCEAN) 0.65 % SOLN nasal spray Place 1 spray into both nostrils as needed for congestion. (Patient not taking: Reported on 06/11/2021)   temazepam (RESTORIL) 7.5 MG capsule Take 1 capsule (7.5 mg total) by mouth at bedtime as needed for sleep.   UBIQUINOL PO Take by mouth daily. (Patient not taking: Reported on 06/11/2021)   No facility-administered encounter medications on file as of 07/27/2021.  07/27/2021 Name: Christian Martin MRN: [id number] DOB: 1950-09-26 Christian Martin is a 70 y.o. year old male who is a primary care patient of Laurey Morale, MD.  Comprehensive medication review performed; Spoke to patient regarding cholesterol  Lipid Panel    Component Value Date/Time   CHOL 193 03/09/2021 0936   TRIG 112.0 03/09/2021 0936   HDL 50.30 03/09/2021 0936   LDLCALC 120 (H) 03/09/2021 0936   LDLCALC 126 (H) 03/08/2020 0830   LDLDIRECT 143.5 12/31/2012 0852    10-year ASCVD risk score: The 10-year ASCVD risk score (Arnett DK, et al., 2019) is: 17.8%   Values used to calculate the score:     Age: 54 years     Sex: Male     Is Non-Hispanic African American: No  Diabetic: No     Tobacco smoker: No     Systolic Blood Pressure: 682 mmHg     Is BP treated: No     HDL Cholesterol: 50.3 mg/dL     Total Cholesterol: 193 mg/dL  Current antihyperlipidemic regimen:  NONE Previous antihyperlipidemic medications tried: None   Adherence Review: Does the patient have >5 day gap between last estimated fill dates? No    Care Gaps: AWV- MSG sent to Ramond Craver CMA to schedule. CCM - Need in June Unable to reach  Star Rating  Drugs: None   Patient Assistance: None  Ned Clines [facility] Clinical Pharmacist Assistant [phone]

## 2021-08-07 ENCOUNTER — Encounter: Payer: Self-pay | Admitting: Family Medicine

## 2021-08-07 DIAGNOSIS — J3089 Other allergic rhinitis: Secondary | ICD-10-CM

## 2021-08-07 DIAGNOSIS — R0981 Nasal congestion: Secondary | ICD-10-CM

## 2021-08-08 DIAGNOSIS — J3089 Other allergic rhinitis: Secondary | ICD-10-CM | POA: Insufficient documentation

## 2021-08-08 NOTE — Telephone Encounter (Signed)
The referral was done  

## 2021-08-13 ENCOUNTER — Telehealth: Payer: Self-pay | Admitting: Family Medicine

## 2021-08-13 NOTE — Telephone Encounter (Signed)
At this point, I think he needs to see an ENT. If he agrees, I will do the referral

## 2021-08-13 NOTE — Telephone Encounter (Signed)
Tried to call patient back but had to leave a voicemail. Requested a call back.

## 2021-08-13 NOTE — Telephone Encounter (Signed)
Pt is calling and would like maddie to return her call concerning nasal congestion on and off for 1 year. The nasal congestion is interfering with his sleep. Pt is looking for strategy. Pt does not use afrin and nasal cort gives him anxiety and decongestants make him depress

## 2021-08-13 NOTE — Telephone Encounter (Signed)
Patient has stopped Afrin about 1 week ago. He reported it was very difficult to stop.  Patient took Flonase about 4pm this afternoon and is going to see how this works and then will plan on using the Zyrtec afterwards if he needs it. He took the Sinex last night and this kept him up all night and does not plan to use again. Consider trial of mucinex to see if this helps loosen mucus.  Patient took the saline nasal spray and this is not helpful for him. He has seen an ENT before and this was not helpful.   Patient uses a version of the Neti pot and has a humidifier and air purifier in his bedroom. Recommended staying hydrated and drinking lots of water.

## 2021-08-13 NOTE — Telephone Encounter (Signed)
I think his idea of taking Zyrtec D is a good one. I would suggest he stop the Afrin altogether, and instead use a combination of Flonase once daily and saline sprays frequently throughout the day

## 2021-08-14 DIAGNOSIS — R0981 Nasal congestion: Secondary | ICD-10-CM | POA: Insufficient documentation

## 2021-08-14 NOTE — Telephone Encounter (Signed)
I did the referral to [facility] ENT

## 2021-08-30 ENCOUNTER — Telehealth: Payer: Self-pay | Admitting: Family Medicine

## 2021-08-30 NOTE — Telephone Encounter (Signed)
Left message for patient to call back and schedule Medicare Annual Wellness Visit (AWV) either virtually or in office. Left  my Herbie Drape number [phone]   Last AWV 09/12/20 Can be calendar year  [employer]  please schedule at anytime with [facility] Nurse Health Advisor 1 or 2   This should be a 45 minute visit.

## 2021-09-06 ENCOUNTER — Other Ambulatory Visit: Payer: Self-pay | Admitting: Family Medicine

## 2021-09-10 ENCOUNTER — Ambulatory Visit (INDEPENDENT_AMBULATORY_CARE_PROVIDER_SITE_OTHER): Payer: Medicare HMO

## 2021-09-10 VITALS — BP 132/62 | HR 69 | Temp 98.0°F | Ht 69.0 in | Wt 153.3 lb

## 2021-09-10 DIAGNOSIS — Z0000 Encounter for general adult medical examination without abnormal findings: Secondary | ICD-10-CM | POA: Diagnosis not present

## 2021-09-10 NOTE — Patient Instructions (Signed)
Christian Martin , Thank you for taking time to come for your Medicare Wellness Visit. I appreciate your ongoing commitment to your health goals. Please review the following plan we discussed and let me know if I can assist you in the future.   These are the goals we discussed:  Goals      Exercise 150 minutes per week (moderate activity)     Try core work; yoga or other      patient     Continue to work on brain function and work word puzzles           This is a list of the screening recommended for you and due dates:  Health Maintenance  Topic Date Due   Tetanus Vaccine  05/06/2026   Colon Cancer Screening  11/08/2029   Pneumonia Vaccine  Completed   Flu Shot  Completed   COVID-19 Vaccine  Completed   Hepatitis C Screening: USPSTF Recommendation to screen - Ages 18-79 yo.  Completed   Zoster (Shingles) Vaccine  Completed   HPV Vaccine  Aged Out   Advanced directives: Yes  Conditions/risks identified: None  Next appointment: Follow up in one year for your annual wellness visit.  Preventive Care 73 Years and Older, Male Preventive care refers to lifestyle choices and visits with your health care provider that can promote health and wellness. What does preventive care include? A yearly physical exam. This is also called an annual well check. Dental exams once or twice a year. Routine eye exams. Ask your health care provider how often you should have your eyes checked. Personal lifestyle choices, including: Daily care of your teeth and gums. Regular physical activity. Eating a healthy diet. Avoiding tobacco and drug use. Limiting alcohol use. Practicing safe sex. Taking low doses of aspirin every day. Taking vitamin and mineral supplements as recommended by your health care provider. What happens during an annual well check? The services and screenings done by your health care provider during your annual well check will depend on your age, overall health, lifestyle risk  factors, and family history of disease. Counseling  Your health care provider may ask you questions about your: Alcohol use. Tobacco use. Drug use. Emotional well-being. Home and relationship well-being. Sexual activity. Eating habits. History of falls. Memory and ability to understand (cognition). Work and work Statistician. Screening  You may have the following tests or measurements: Height, weight, and BMI. Blood pressure. Lipid and cholesterol levels. These may be checked every 5 years, or more frequently if you are over 59 years old. Skin check. Lung cancer screening. You may have this screening every year starting at age 10 if you have a 30-pack-year history of smoking and currently smoke or have quit within the past 15 years. Fecal occult blood test (FOBT) of the stool. You may have this test every year starting at age 73. Flexible sigmoidoscopy or colonoscopy. You may have a sigmoidoscopy every 5 years or a colonoscopy every 10 years starting at age 73. Prostate cancer screening. Recommendations will vary depending on your family history and other risks. Hepatitis C blood test. Hepatitis B blood test. Sexually transmitted disease (STD) testing. Diabetes screening. This is done by checking your blood sugar (glucose) after you have not eaten for a while (fasting). You may have this done every 1-3 years. Abdominal aortic aneurysm (AAA) screening. You may need this if you are a current or former smoker. Osteoporosis. You may be screened starting at age 73 if you are at high  risk. Talk with your health care provider about your test results, treatment options, and if necessary, the need for more tests. Vaccines  Your health care provider may recommend certain vaccines, such as: Influenza vaccine. This is recommended every year. Tetanus, diphtheria, and acellular pertussis (Tdap, Td) vaccine. You may need a Td booster every 10 years. Zoster vaccine. You may need this after age  73. Pneumococcal 13-valent conjugate (PCV13) vaccine. One dose is recommended after age 73. Pneumococcal polysaccharide (PPSV23) vaccine. One dose is recommended after age 73. Talk to your health care provider about which screenings and vaccines you need and how often you need them. This information is not intended to replace advice given to you by your health care provider. Make sure you discuss any questions you have with your health care provider. Document Released: 09/08/2015 Document Revised: 05/01/2016 Document Reviewed: 06/13/2015 Elsevier Interactive Patient Education  2017 [place] Prevention in the Home Falls can cause injuries. They can happen to people of all ages. There are many things you can do to make your home safe and to help prevent falls. What can I do on the outside of my home? Regularly fix the edges of walkways and driveways and fix any cracks. Remove anything that might make you trip as you walk through a door, such as a raised step or threshold. Trim any bushes or trees on the path to your home. Use bright outdoor lighting. Clear any walking paths of anything that might make someone trip, such as rocks or tools. Regularly check to see if handrails are loose or broken. Make sure that both sides of any steps have handrails. Any raised decks and porches should have guardrails on the edges. Have any leaves, snow, or ice cleared regularly. Use sand or salt on walking paths during winter. Clean up any spills in your garage right away. This includes oil or grease spills. What can I do in the bathroom? Use night lights. Install grab bars by the toilet and in the tub and shower. Do not use towel bars as grab bars. Use non-skid mats or decals in the tub or shower. If you need to sit down in the shower, use a plastic, non-slip stool. Keep the floor dry. Clean up any water that spills on the floor as soon as it happens. Remove soap buildup in the tub or shower  regularly. Attach bath mats securely with double-sided non-slip rug tape. Do not have throw rugs and other things on the floor that can make you trip. What can I do in the bedroom? Use night lights. Make sure that you have a light by your bed that is easy to reach. Do not use any sheets or blankets that are too big for your bed. They should not hang down onto the floor. Have a firm chair that has side arms. You can use this for support while you get dressed. Do not have throw rugs and other things on the floor that can make you trip. What can I do in the kitchen? Clean up any spills right away. Avoid walking on wet floors. Keep items that you use a lot in easy-to-reach places. If you need to reach something above you, use a strong step stool that has a grab bar. Keep electrical cords out of the way. Do not use floor polish or wax that makes floors slippery. If you must use wax, use non-skid floor wax. Do not have throw rugs and other things on the floor that can  make you trip. What can I do with my stairs? Do not leave any items on the stairs. Make sure that there are handrails on both sides of the stairs and use them. Fix handrails that are broken or loose. Make sure that handrails are as long as the stairways. Check any carpeting to make sure that it is firmly attached to the stairs. Fix any carpet that is loose or worn. Avoid having throw rugs at the top or bottom of the stairs. If you do have throw rugs, attach them to the floor with carpet tape. Make sure that you have a light switch at the top of the stairs and the bottom of the stairs. If you do not have them, ask someone to add them for you. What else can I do to help prevent falls? Wear shoes that: Do not have high heels. Have rubber bottoms. Are comfortable and fit you well. Are closed at the toe. Do not wear sandals. If you use a stepladder: Make sure that it is fully opened. Do not climb a closed stepladder. Make sure that  both sides of the stepladder are locked into place. Ask someone to hold it for you, if possible. Clearly mark and make sure that you can see: Any grab bars or handrails. First and last steps. Where the edge of each step is. Use tools that help you move around (mobility aids) if they are needed. These include: Canes. Walkers. Scooters. Crutches. Turn on the lights when you go into a dark area. Replace any light bulbs as soon as they burn out. Set up your furniture so you have a clear path. Avoid moving your furniture around. If any of your floors are uneven, fix them. If there are any pets around you, be aware of where they are. Review your medicines with your doctor. Some medicines can make you feel dizzy. This can increase your chance of falling. Ask your doctor what other things that you can do to help prevent falls. This information is not intended to replace advice given to you by your health care provider. Make sure you discuss any questions you have with your health care provider. Document Released: 06/08/2009 Document Revised: 01/18/2016 Document Reviewed: 09/16/2014 Elsevier Interactive Patient Education  2017 [employer].

## 2021-09-10 NOTE — Progress Notes (Signed)
Subjective:   Christian Martin is a 71 y.o. male who presents for Medicare Annual/Subsequent preventive examination.  Review of Systems    No ROS Cardiac Risk Factors include: advanced age (>35men, >10 women)     Objective:    Today's Vitals   09/10/21 0907  BP: 132/62  Pulse: 69  Temp: 98 F (36.7 C)  TempSrc: Oral  SpO2: 98%  Weight: 153 lb 4.8 oz (69.5 kg)  Height: 5\' 9"  (1.753 m)   Body mass index is 22.64 kg/m.  Advanced Directives 09/10/2021 09/12/2020 06/21/2016  Does Patient Have a Medical Advance Directive? Yes Yes No  Type of Paramedic of [place];Living will [place];Living will -  Does patient want to make changes to medical advance directive? No - Patient declined No - Patient declined -  Copy of [place] in Chart? No - copy requested Yes - validated most recent copy scanned in chart (See row information) -  Would patient like information on creating a medical advance directive? - - No - patient declined information    Current Medications (verified) Outpatient Encounter Medications as of 09/10/2021  Medication Sig   ALFALFA PO Take by mouth daily. 2.4 grams (Patient not taking: Reported on 06/11/2021)   APPLE CIDER VINEGAR PO Take by mouth daily. (Patient not taking: Reported on 06/11/2021)   Cholecalciferol (VITAMIN D PO) Take by mouth. (Patient not taking: Reported on 06/11/2021)   CITRUS BERGAMOT PO Take 200 mg by mouth daily. (Patient not taking: Reported on 06/11/2021)   ELDERBERRY PO Take by mouth daily. (Patient not taking: Reported on 06/11/2021)   fish oil-omega-3 fatty acids 1000 MG capsule Take 2 g by mouth daily. (Patient not taking: Reported on 64/40/3474)   Garlic 2595 MG CAPS Take by mouth. (Patient not taking: Reported on 06/11/2021)   MELATONIN PO Take 1 mg by mouth at bedtime as needed.   MILK THISTLE PO Take by mouth daily. (Patient not taking: Reported on 06/11/2021)    omeprazole (PRILOSEC) 40 MG capsule TAKE 1 CAPSULE (40 MG TOTAL) BY MOUTH DAILY.   ondansetron (ZOFRAN) 8 MG tablet Take 1 tablet (8 mg total) by mouth every 6 (six) hours as needed for nausea or vomiting.   Probiotic Product (PROBIOTIC PO) Take by mouth daily. (Patient not taking: Reported on 06/11/2021)   ROYAL JELLY PO Take by mouth daily. (Patient not taking: Reported on 06/11/2021)   sodium chloride (OCEAN) 0.65 % SOLN nasal spray Place 1 spray into both nostrils as needed for congestion. (Patient not taking: Reported on 06/11/2021)   temazepam (RESTORIL) 7.5 MG capsule Take 1 capsule (7.5 mg total) by mouth at bedtime as needed for sleep.   UBIQUINOL PO Take by mouth daily. (Patient not taking: Reported on 06/11/2021)   [DISCONTINUED] Green Tea, Camellia sinensis, (GREEN TEA EXTRACT PO) Take 1 capsule by mouth daily. Also known as EGCG (Patient not taking: Reported on 06/11/2021)   No facility-administered encounter medications on file as of 09/10/2021.    Allergies (verified) Penicillins and Trazodone and nefazodone   History: Past Medical History:  Diagnosis Date   Hx of adenomatous colonic polyps    Hyperlipidemia    Retinal tear of right eye 2016   saw Dr. Prudencio Burly    Past Surgical History:  Procedure Laterality Date   COLONOSCOPY  11/09/2019   per Dr. Havery Moros, adenomatous polyp, repeat in 7 yrs    LACERATION REPAIR     Please update my chart:  01-18-18 Lacerated forehead wound 1.5" treated with 6 sutures at [facility] on [address].    WISDOM TOOTH EXTRACTION     Family History  Problem Relation Age of Onset   Cancer Sister        breast   Osteoporosis Mother    Hypertension Mother    Heart failure Father    Breast cancer Other    Coronary artery disease Other    Colon cancer Neg Hx    Esophageal cancer Neg Hx    Rectal cancer Neg Hx    Stomach cancer Neg Hx    Social History   Socioeconomic History   Marital status: Single    Spouse name: Not on file   Number of  children: Not on file   Years of education: Not on file   Highest education level: Not on file  Occupational History   Not on file  Tobacco Use   Smoking status: Never   Smokeless tobacco: Never  Substance and Sexual Activity   Alcohol use: Not Currently    Alcohol/week: 0.0 standard drinks    Comment: glass of wine each day   Drug use: No   Sexual activity: Not on file  Other Topics Concern   Not on file  Social History Narrative   Not on file   Social Determinants of Health   Financial Resource Strain: Low Risk    Difficulty of Paying Living Expenses: Not hard at all  Food Insecurity: No Food Insecurity   Worried About Charity fundraiser in the Last Year: Never true   Ran Out of Food in the Last Year: Never true  Transportation Needs: No Transportation Needs   Lack of Transportation (Medical): No   Lack of Transportation (Non-Medical): No  Physical Activity: Insufficiently Active   Days of Exercise per Week: 3 days   Minutes of Exercise per Session: 30 min  Stress: No Stress Concern Present   Feeling of Stress : Only a little  Social Connections: Moderately Isolated   Frequency of Communication with Friends and Family: More than three times a week   Frequency of Social Gatherings with Friends and Family: More than three times a week   Attends Religious Services: Never   Marine scientist or Organizations: Yes   Attends Music therapist: More than 4 times per year   Marital Status: Never married    Clinical Intake:  How often do you need to have someone help you when you read instructions, pamphlets, or other written materials from your doctor or pharmacy?: 1 - Never  Diabetic? No  Interpreter Needed?: NoActivities of Daily Living In your present state of health, do you have any difficulty performing the following activities: 09/10/2021 09/07/2021  Hearing? N N  Vision? N N  Difficulty concentrating or making decisions? N N  Walking or climbing  stairs? N N  Dressing or bathing? N N  Doing errands, shopping? N N  Preparing Food and eating ? N N  Using the Toilet? N N  In the past six months, have you accidently leaked urine? N N  Do you have problems with loss of bowel control? N N  Managing your Medications? N N  Managing your Finances? N N  Housekeeping or managing your Housekeeping? N N  Some recent data might be hidden    Patient Care Team: Laurey Morale, MD as PCP - General Kipp Brood Mariam Dollar, [facility] as Pharmacist (Pharmacist)  Indicate any recent Medical Services you  may have received from other than [facility] providers in the past year (date may be approximate).     Assessment:   This is a routine wellness examination for Zaelyn.  Hearing/Vision screen Hearing Screening - Comments:: No difficulty hearing Vision Screening - Comments:: Wears glasses. Followed by [facility]  Dietary issues and exercise activities discussed: Current Exercise Habits: Home exercise routine, Type of exercise: Other - see comments (Patiwnt swims), Time (Minutes): 30, Frequency (Times/Week): 3, Weekly Exercise (Minutes/Week): 90   Goals Addressed             This Visit's Progress    Exercise 150 minutes per week (moderate activity)       Try core work; yoga or other.     patient       Continue to work on brain function and work word puzzles.          Depression Screen PHQ 2/9 Scores 09/10/2021 03/09/2021 09/12/2020 06/21/2016 01/12/2015  PHQ - 2 Score 0 1 0 0 0  PHQ- 9 Score - 5 - - -    Fall Risk Fall Risk  09/10/2021 09/07/2021 03/09/2021 09/12/2020 01/05/2018  Falls in the past year? 0 0 0 0 No  Number falls in past yr: 0 0 0 0 -  Injury with Fall? 0 0 0 0 -  Risk for fall due to : - - - No Fall Risks -  Follow up - - - Falls evaluation completed;Falls prevention discussed -    FALL RISK PREVENTION PERTAINING TO THE HOME:  Any stairs in or around the home? Yes  If so, are there any without handrails? No  Home free of loose  throw rugs in walkways, pet beds, electrical cords, etc? Yes  Adequate lighting in your home to reduce risk of falls? Yes   ASSISTIVE DEVICES UTILIZED TO PREVENT FALLS:  Life alert? No  Use of a cane, walker or w/c? No  Grab bars in the bathroom? No  Shower chair or bench in shower? No  Elevated toilet seat or a handicapped toilet? Yes   TIMED UP AND GO:  Was the test performed? Yes .  Length of time to ambulate 10 feet: 5  sec.   Gait steady and fast without use of assistive device  Cognitive Function: MMSE - Mini Mental State Exam 06/21/2016  Not completed: (No Data)     6CIT Screen 09/10/2021  What Year? 0 points  What month? 0 points  What time? 0 points  Count back from 20 0 points  Months in reverse 0 points  Repeat phrase 0 points  Total Score 0    Immunizations Immunization History  Administered Date(s) Administered   Fluad Quad(high Dose 65+) 05/24/2021   Influenza, High Dose Seasonal PF 05/31/2017, 04/22/2019, 06/11/2020   Influenza,inj,Quad PF,6+ Mos 04/21/2015, 05/06/2016   Influenza-Unspecified 05/31/2017   PFIZER(Purple Top)SARS-COV-2 Vaccination 09/30/2019, 10/25/2019, 05/28/2020, 12/26/2020   Pfizer Covid-19 Vaccine Bivalent Booster 70yrs & up 05/07/2021   Pneumococcal Conjugate-13 01/10/2014   Pneumococcal Polysaccharide-23 04/21/2015   Tdap 05/06/2016   Zoster Recombinat (Shingrix) 03/08/2019, 05/10/2019   Zoster, Live 01/08/2013   Screening Tests Health Maintenance  Topic Date Due   TETANUS/TDAP  05/06/2026   COLONOSCOPY (Pts 45-82yrs Insurance coverage will need to be confirmed)  11/08/2029   Pneumonia Vaccine 66+ Years old  Completed   INFLUENZA VACCINE  Completed   COVID-19 Vaccine  Completed   Hepatitis C Screening  Completed   Zoster Vaccines- Shingrix  Completed   HPV  VACCINES  Aged Out    Health Maintenance  There are no preventive care reminders to display for this patient.   Additional Screening:   Vision Screening:  Recommended annual ophthalmology exams for early detection of glaucoma and other disorders of the eye. Is the patient up to date with their annual eye exam?  Yes  Who is the provider or what is the name of the office in which the patient attends annual eye exams? Followed by [facility] Screening: Recommended annual dental exams for proper oral hygiene  Community Resource Referral / Chronic Care Management:  CRR required this visit?  No   CCM required this visit?  No      Plan:     I have personally reviewed and noted the following in the patients chart:   Medical and social history Use of alcohol, tobacco or illicit drugs  Current medications and supplements including opioid prescriptions. Patient is not currently taking opioid prescriptions. Functional ability and status Nutritional status Physical activity Advanced directives List of other physicians Hospitalizations, surgeries, and ER visits in previous 12 months Vitals Screenings to include cognitive, depression, and falls Referrals and appointments  In addition, I have reviewed and discussed with patient certain preventive protocols, quality metrics, and best practice recommendations. A written personalized care plan for preventive services as well as general preventive health recommendations were provided to patient.     [name], LPN   5/63/1497

## 2021-09-18 ENCOUNTER — Encounter: Payer: Self-pay | Admitting: Family Medicine

## 2021-09-18 DIAGNOSIS — H6123 Impacted cerumen, bilateral: Secondary | ICD-10-CM | POA: Diagnosis not present

## 2021-09-18 DIAGNOSIS — J3489 Other specified disorders of nose and nasal sinuses: Secondary | ICD-10-CM | POA: Diagnosis not present

## 2021-09-18 DIAGNOSIS — J343 Hypertrophy of nasal turbinates: Secondary | ICD-10-CM | POA: Diagnosis not present

## 2021-09-21 ENCOUNTER — Ambulatory Visit: Payer: Medicare HMO | Admitting: Family Medicine

## 2021-10-15 DIAGNOSIS — J300 Vasomotor rhinitis: Secondary | ICD-10-CM | POA: Diagnosis not present

## 2021-10-15 DIAGNOSIS — J3089 Other allergic rhinitis: Secondary | ICD-10-CM | POA: Diagnosis not present

## 2021-10-18 DIAGNOSIS — J342 Deviated nasal septum: Secondary | ICD-10-CM | POA: Diagnosis not present

## 2021-10-18 DIAGNOSIS — J343 Hypertrophy of nasal turbinates: Secondary | ICD-10-CM | POA: Diagnosis not present

## 2021-10-18 DIAGNOSIS — J3489 Other specified disorders of nose and nasal sinuses: Secondary | ICD-10-CM | POA: Diagnosis not present

## 2021-10-24 ENCOUNTER — Telehealth: Payer: Self-pay | Admitting: Pharmacist

## 2021-10-24 NOTE — Chronic Care Management (AMB) (Unsigned)
Chronic Care Management Pharmacy Assistant   Name: Christian Martin  MRN: [id number] DOB: 1951-07-17  Reason for Encounter: Disease State   Conditions to be addressed/monitored: HLD  Recent office visits:  09/10/21 [name], LPN - Patient presented for [facility] Annual Wellness Exam. Stopped Green Tea.  Recent consult visits:  None   Hospital visits:  None in previous 6 months  Medications: Outpatient Encounter Medications as of 10/24/2021  Medication Sig   ALFALFA PO Take by mouth daily. 2.4 grams (Patient not taking: Reported on 06/11/2021)   APPLE CIDER VINEGAR PO Take by mouth daily. (Patient not taking: Reported on 06/11/2021)   Cholecalciferol (VITAMIN D PO) Take by mouth. (Patient not taking: Reported on 06/11/2021)   CITRUS BERGAMOT PO Take 200 mg by mouth daily. (Patient not taking: Reported on 06/11/2021)   ELDERBERRY PO Take by mouth daily. (Patient not taking: Reported on 06/11/2021)   fish oil-omega-3 fatty acids 1000 MG capsule Take 2 g by mouth daily. (Patient not taking: Reported on 23/55/7322)   Garlic 0254 MG CAPS Take by mouth. (Patient not taking: Reported on 06/11/2021)   MELATONIN PO Take 1 mg by mouth at bedtime as needed.   MILK THISTLE PO Take by mouth daily. (Patient not taking: Reported on 06/11/2021)   omeprazole (PRILOSEC) 40 MG capsule TAKE 1 CAPSULE (40 MG TOTAL) BY MOUTH DAILY.   ondansetron (ZOFRAN) 8 MG tablet Take 1 tablet (8 mg total) by mouth every 6 (six) hours as needed for nausea or vomiting.   Probiotic Product (PROBIOTIC PO) Take by mouth daily. (Patient not taking: Reported on 06/11/2021)   ROYAL JELLY PO Take by mouth daily. (Patient not taking: Reported on 06/11/2021)   sodium chloride (OCEAN) 0.65 % SOLN nasal spray Place 1 spray into both nostrils as needed for congestion. (Patient not taking: Reported on 06/11/2021)   temazepam (RESTORIL) 7.5 MG capsule Take 1 capsule (7.5 mg total) by mouth at bedtime as needed for sleep.    UBIQUINOL PO Take by mouth daily. (Patient not taking: Reported on 06/11/2021)   No facility-administered encounter medications on file as of 10/24/2021.  10/24/2021 Name: Christian Martin MRN: [id number] DOB: [birth year] Christian Martin is a 71 y.o. year old male who is a primary care patient of Christian Morale, MD.  Comprehensive medication review performed; Spoke to patient regarding cholesterol  Lipid Panel    Component Value Date/Time   CHOL 193 03/09/2021 0936   TRIG 112.0 03/09/2021 0936   HDL 50.30 03/09/2021 0936   LDLCALC 120 (H) 03/09/2021 0936   LDLCALC 126 (H) 03/08/2020 0830   LDLDIRECT 143.5 12/31/2012 0852    10-year ASCVD risk score: The 10-year ASCVD risk score (Arnett DK, et al., 2019) is: 21.2%   Values used to calculate the score:     Age: 71 years     Sex: Male     Is Non-Hispanic African American: No     Diabetic: No     Tobacco smoker: No     Systolic Blood Pressure: 831 mmHg     Is BP treated: No     HDL Cholesterol: 50.3 mg/dL     Total Cholesterol: 193 mg/dL  Current antihyperlipidemic regimen:  None Previous antihyperlipidemic medications tried: None ASCVD risk enhancing conditions: age >73 What recent interventions/DTPs have been made by any provider to improve Cholesterol control since last CPP Visit: None Any recent hospitalizations or ED visits since last visit with CPP? No What diet changes have  been made to improve Cholesterol?  *** What exercise is being done to improve Cholesterol?  ***  Adherence Review: Does the patient have >5 day gap between last estimated fill dates? No      Care Gaps: AWV- 1/23 CCM-  Star Rating Drugs: None    Ned Clines [facility] Clinical Pharmacist Assistant [phone]

## 2021-10-26 ENCOUNTER — Encounter: Payer: Self-pay | Admitting: Family Medicine

## 2021-11-09 DIAGNOSIS — J343 Hypertrophy of nasal turbinates: Secondary | ICD-10-CM | POA: Diagnosis not present

## 2021-11-09 DIAGNOSIS — J3489 Other specified disorders of nose and nasal sinuses: Secondary | ICD-10-CM | POA: Diagnosis not present

## 2021-11-09 DIAGNOSIS — J342 Deviated nasal septum: Secondary | ICD-10-CM | POA: Diagnosis not present

## 2021-11-09 HISTORY — PX: NASAL SEPTOPLASTY W/ TURBINOPLASTY: SHX2070

## 2021-11-13 ENCOUNTER — Encounter: Payer: Self-pay | Admitting: Family Medicine

## 2021-11-14 ENCOUNTER — Encounter: Payer: Self-pay | Admitting: Family Medicine

## 2021-11-14 NOTE — Telephone Encounter (Signed)
I updated his chart  ?

## 2021-12-20 DIAGNOSIS — E46 Unspecified protein-calorie malnutrition: Secondary | ICD-10-CM | POA: Diagnosis not present

## 2021-12-20 DIAGNOSIS — R030 Elevated blood-pressure reading, without diagnosis of hypertension: Secondary | ICD-10-CM | POA: Diagnosis not present

## 2021-12-20 DIAGNOSIS — Z803 Family history of malignant neoplasm of breast: Secondary | ICD-10-CM | POA: Diagnosis not present

## 2021-12-20 DIAGNOSIS — Z8249 Family history of ischemic heart disease and other diseases of the circulatory system: Secondary | ICD-10-CM | POA: Diagnosis not present

## 2021-12-20 DIAGNOSIS — J309 Allergic rhinitis, unspecified: Secondary | ICD-10-CM | POA: Diagnosis not present

## 2022-02-02 ENCOUNTER — Encounter: Payer: Self-pay | Admitting: Family Medicine

## 2022-03-05 IMAGING — CT CT CARDIAC CORONARY ARTERY CALCIUM SCORE
3 series · 14 of 20 positions shown, 16 images · non-contrast
Comparison: 01/10/2014 chest radiograph
COMPARISON: 01/10/2014 chest radiograph

Addendum:
EXAM:
OVER-READ INTERPRETATION  CT CHEST

The following report is an over-read performed by radiologist Dr.
Sampion Atik [REDACTED] on 04/03/2021. This over-read
does not include interpretation of cardiac or coronary anatomy or
pathology. The calcium score interpretation by the cardiologist is
attached.
TECHNIQUE: A gated, non-contrast computed tomography scan of the heart was
performed using 3mm slice thickness. Axial images were analyzed on a
dedicated workstation. Calcium scoring of the coronary arteries was
performed using the Agatston method.

[Series 2: casc 2.0 sa36 2 bestdiast 74 % (id) · axial · 0.45mm/px · z∈[-202,-132]mm · 6 of 51 slices shown, 8 images]
[im 8/51  vessel]
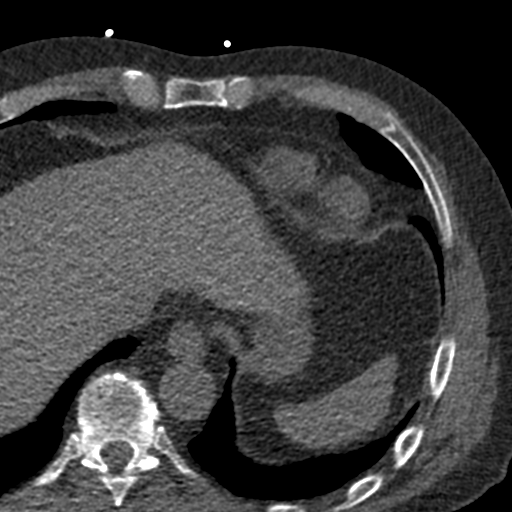
[im 8/51  lung]
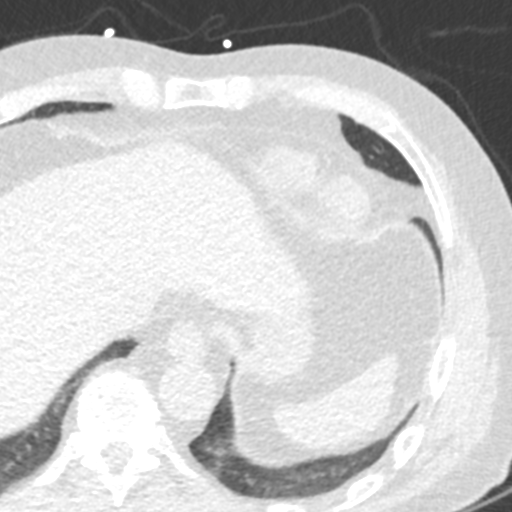
[im 15/51  vessel]
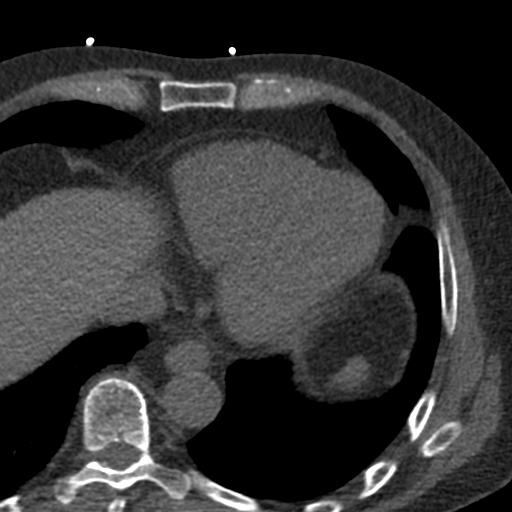
[im 22/51  vessel]
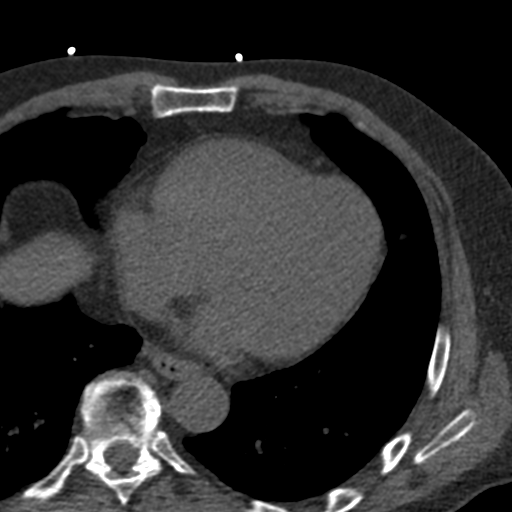
[im 29/51  vessel]
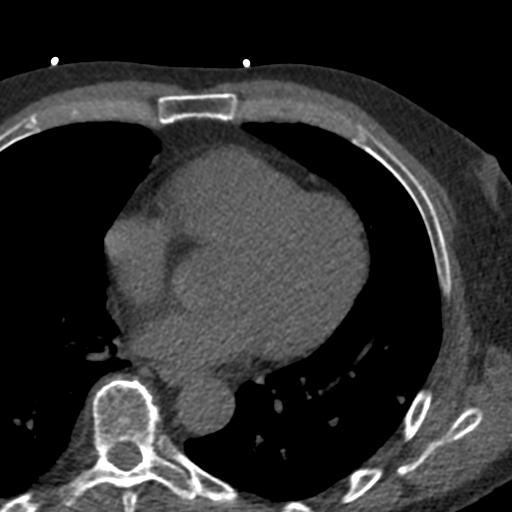
[im 36/51  vessel]
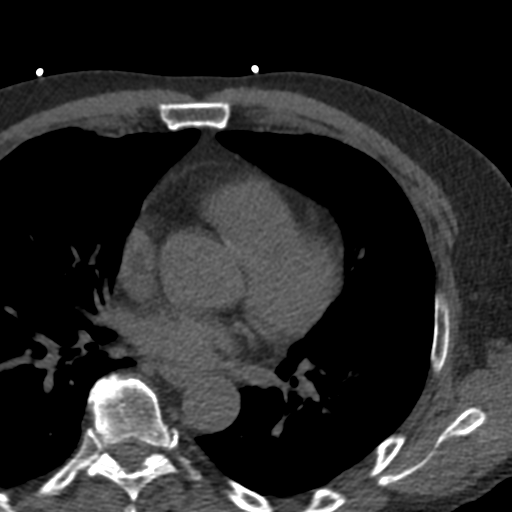
[im 36/51  lung]
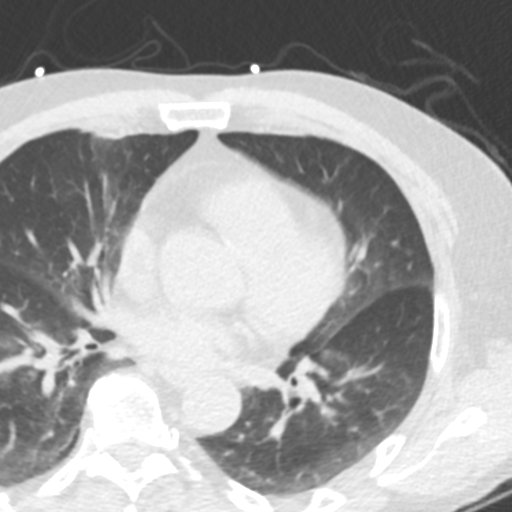
[im 43/51  vessel]
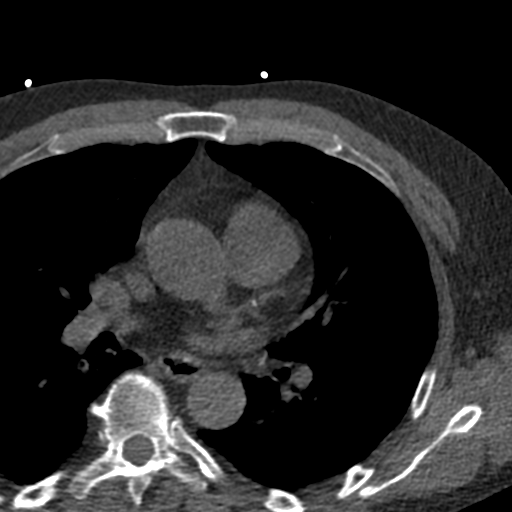

[Series 3: lung 74 % · axial · 0.70mm/px · z∈[-196,-136]mm · 4 of 34 slices shown]
[im 7/34  lung]
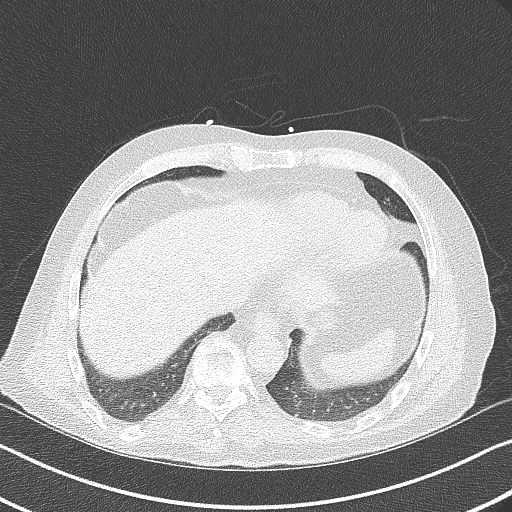
[im 14/34  lung]
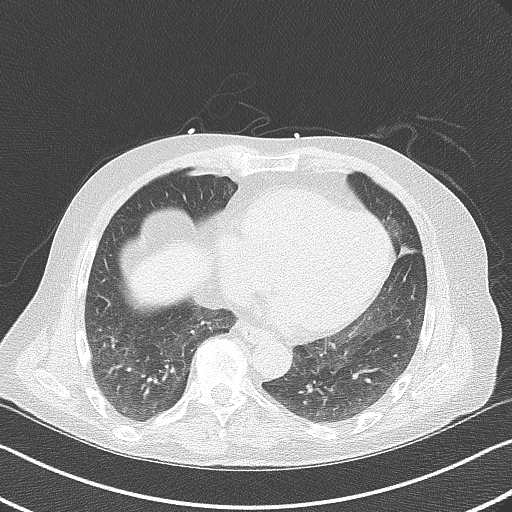
[im 20/34  lung]
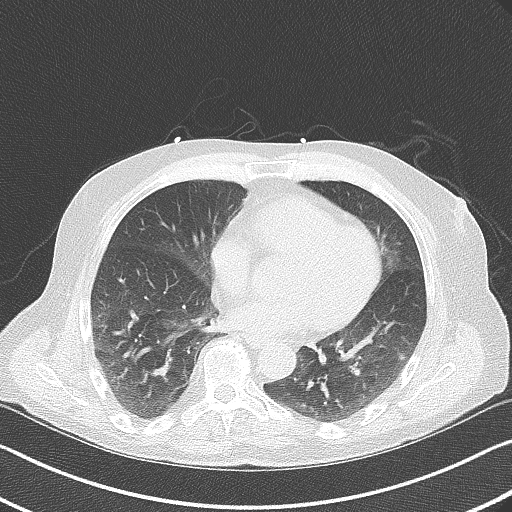
[im 27/34  lung]
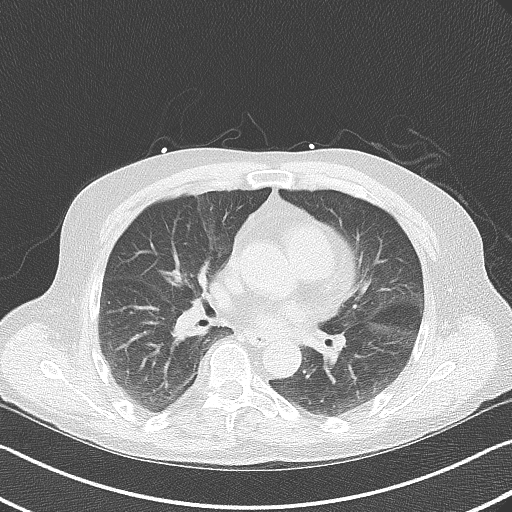

[Series 4: lung st 74 % · axial · 0.70mm/px · z∈[-196,-136]mm · 4 of 34 slices shown]
[im 7/34  lung]
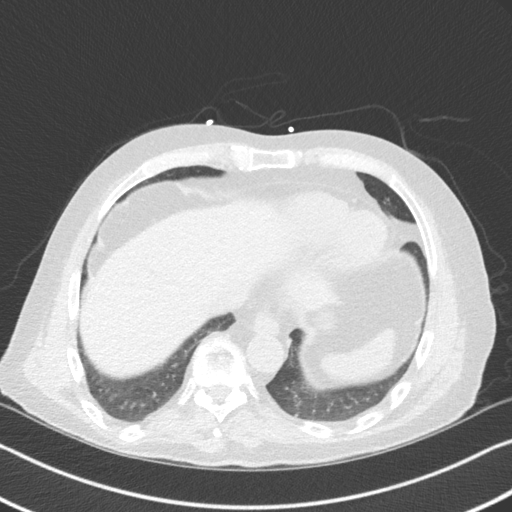
[im 14/34  lung]
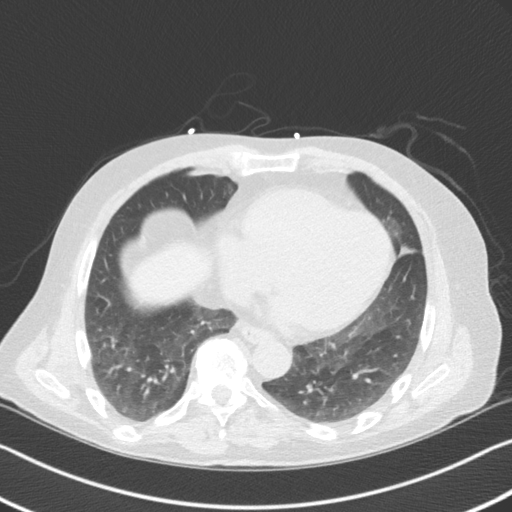
[im 20/34  lung]
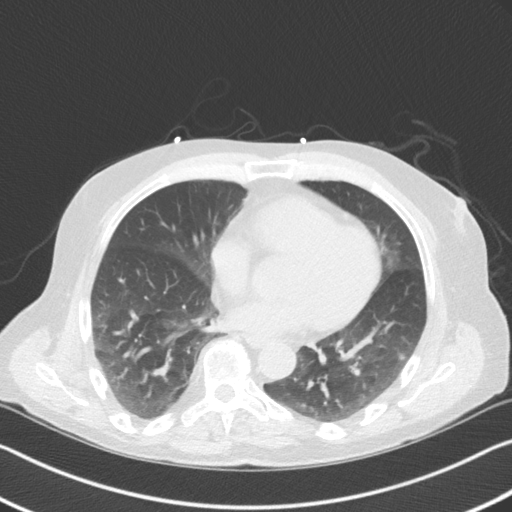
[im 27/34  lung]
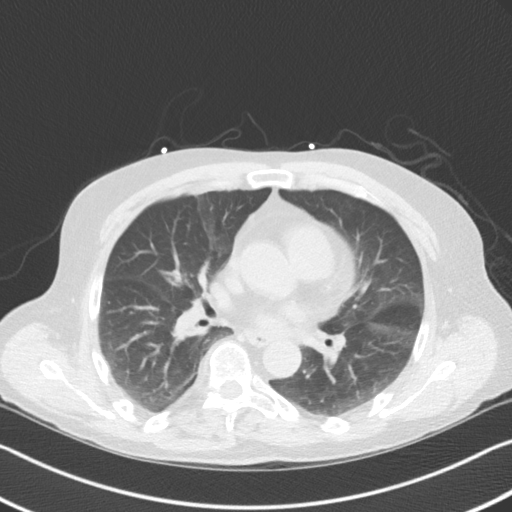

[14 of 20 positions shown; findings below may reference images not displayed]

FINDINGS: Vascular: Normal aortic caliber.

Mediastinum/Nodes: No imaged thoracic adenopathy.

Lungs/Pleura: No pleural fluid. Subpleural left lower lobe 3 mm
pulmonary nodule on [DATE].

Upper Abdomen: Normal imaged portions of the liver, spleen, stomach.

Musculoskeletal: Midthoracic spondylosis.
IMPRESSION: No acute findings in the imaged extracardiac chest.

3 mm left lower lobe pulmonary nodule. No follow-up needed if
patient is low-risk. Non-contrast chest CT can be considered in 12
months if patient is high-risk. This recommendation follows the
consensus statement: Guidelines for Management of Incidental
Pulmonary Nodules Detected on CT Images: From the [REDACTED]AL DATA:  Cardiovascular Disease Risk stratification

EXAM:
Coronary Calcium Score
FINDINGS: Coronary arteries: Normal origins.

Coronary Calcium Score:

Left main: 0

Left anterior descending artery: 5

Left circumflex artery: 0

Right coronary artery: 0

Total: 5

Percentile: 15

Pericardium: Normal.

Ascending Aorta: Normal caliber.

Non-cardiac: See separate report from [REDACTED].
IMPRESSION: Coronary calcium score of 5. This was 15th percentile for age-,
race-, and sex-matched controls.



If CAC=0, it is reasonable to withhold statin therapy and reassess
in 5 to 10 years, as long as higher risk conditions are absent
(diabetes mellitus, family history of premature CHD in first degree
relatives (males <55 years; females <65 years), cigarette smoking,
or LDL >=190 mg/dL).

If CAC is 1 to 99, it is reasonable to initiate statin therapy for
patients >=55 years of age.

If CAC is >=100 or >=75th percentile, it is reasonable to initiate
statin therapy at any age.

Cardiology referral should be considered for patients with CAC
scores >=400 or >=75th percentile.

*2254 AHA/ACC/AACVPR/AAPA/ABC/FUNKE/SOLACHE/LALJI/Driggers/BENSAFI/TAINA/WIEHANN
Guideline on the Management of Blood Cholesterol: A Report of the
American College of Cardiology/American Heart Association Task Force
on Clinical Practice Guidelines. J Am Coll Cardiol.
1355;73(24):9297-9998.

*** End of Addendum ***
EXAM:
OVER-READ INTERPRETATION  CT CHEST

The following report is an over-read performed by radiologist Dr.
Sampion Atik [REDACTED] on 04/03/2021. This over-read
does not include interpretation of cardiac or coronary anatomy or
pathology. The calcium score interpretation by the cardiologist is
attached.
FINDINGS: Vascular: Normal aortic caliber.

Mediastinum/Nodes: No imaged thoracic adenopathy.

Lungs/Pleura: No pleural fluid. Subpleural left lower lobe 3 mm
pulmonary nodule on [DATE].

Upper Abdomen: Normal imaged portions of the liver, spleen, stomach.

Musculoskeletal: Midthoracic spondylosis.
IMPRESSION: No acute findings in the imaged extracardiac chest.

3 mm left lower lobe pulmonary nodule. No follow-up needed if
patient is low-risk. Non-contrast chest CT can be considered in 12
months if patient is high-risk. This recommendation follows the
consensus statement: Guidelines for Management of Incidental
Pulmonary Nodules Detected on CT Images: From the [HOSPITAL]

## 2022-03-12 ENCOUNTER — Encounter: Payer: Self-pay | Admitting: Family Medicine

## 2022-03-12 ENCOUNTER — Ambulatory Visit (INDEPENDENT_AMBULATORY_CARE_PROVIDER_SITE_OTHER): Payer: Medicare HMO | Admitting: Family Medicine

## 2022-03-12 VITALS — BP 110/70 | HR 65 | Temp 98.8°F | Ht 69.25 in | Wt 156.0 lb

## 2022-03-12 DIAGNOSIS — R69 Illness, unspecified: Secondary | ICD-10-CM | POA: Diagnosis not present

## 2022-03-12 DIAGNOSIS — N401 Enlarged prostate with lower urinary tract symptoms: Secondary | ICD-10-CM | POA: Diagnosis not present

## 2022-03-12 DIAGNOSIS — R0981 Nasal congestion: Secondary | ICD-10-CM | POA: Diagnosis not present

## 2022-03-12 DIAGNOSIS — R739 Hyperglycemia, unspecified: Secondary | ICD-10-CM | POA: Diagnosis not present

## 2022-03-12 DIAGNOSIS — J3089 Other allergic rhinitis: Secondary | ICD-10-CM | POA: Diagnosis not present

## 2022-03-12 DIAGNOSIS — F419 Anxiety disorder, unspecified: Secondary | ICD-10-CM

## 2022-03-12 DIAGNOSIS — N138 Other obstructive and reflux uropathy: Secondary | ICD-10-CM | POA: Diagnosis not present

## 2022-03-12 DIAGNOSIS — E785 Hyperlipidemia, unspecified: Secondary | ICD-10-CM | POA: Diagnosis not present

## 2022-03-12 DIAGNOSIS — G629 Polyneuropathy, unspecified: Secondary | ICD-10-CM

## 2022-03-12 LAB — CBC WITH DIFFERENTIAL/PLATELET
Basophils Absolute: 0.1 10*3/uL (ref 0.0–0.1)
Basophils Relative: 0.7 % (ref 0.0–3.0)
Eosinophils Absolute: 0.1 10*3/uL (ref 0.0–0.7)
Eosinophils Relative: 0.9 % (ref 0.0–5.0)
HCT: 43.4 % (ref 39.0–52.0)
Hemoglobin: 14.4 g/dL (ref 13.0–17.0)
Lymphocytes Relative: 29.7 % (ref 12.0–46.0)
Lymphs Abs: 2 10*3/uL (ref 0.7–4.0)
MCHC: 33.1 g/dL (ref 30.0–36.0)
MCV: 93.3 fl (ref 78.0–100.0)
Monocytes Absolute: 0.5 10*3/uL (ref 0.1–1.0)
Monocytes Relative: 7.9 % (ref 3.0–12.0)
Neutro Abs: 4.1 10*3/uL (ref 1.4–7.7)
Neutrophils Relative %: 60.8 % (ref 43.0–77.0)
Platelets: 230 10*3/uL (ref 150.0–400.0)
RBC: 4.65 Mil/uL (ref 4.22–5.81)
RDW: 12.7 % (ref 11.5–15.5)
WBC: 6.8 10*3/uL (ref 4.0–10.5)

## 2022-03-12 LAB — BASIC METABOLIC PANEL
BUN: 21 mg/dL (ref 6–23)
CO2: 28 mEq/L (ref 19–32)
Calcium: 9.4 mg/dL (ref 8.4–10.5)
Chloride: 101 mEq/L (ref 96–112)
Creatinine, Ser: 1.09 mg/dL (ref 0.40–1.50)
GFR: 67.47 mL/min (ref >60.00)
Glucose, Bld: 83 mg/dL (ref 70–99)
Potassium: 5 mEq/L (ref 3.5–5.1)
Sodium: 138 mEq/L (ref 135–145)

## 2022-03-12 LAB — LIPID PANEL
Cholesterol: 208 mg/dL — ABNORMAL HIGH (ref 0–200)
HDL: 54.1 mg/dL (ref >39.00)
LDL Cholesterol: 131 mg/dL — ABNORMAL HIGH (ref 0–99)
NonHDL: 154.26
Total CHOL/HDL Ratio: 4
Triglycerides: 115 mg/dL (ref 0.0–149.0)
VLDL: 23 mg/dL (ref 0.0–40.0)

## 2022-03-12 LAB — HEPATIC FUNCTION PANEL
ALT: 15 U/L (ref 0–53)
AST: 20 U/L (ref 0–37)
Albumin: 4.6 g/dL (ref 3.5–5.2)
Alkaline Phosphatase: 87 U/L (ref 39–117)
Bilirubin, Direct: 0.1 mg/dL (ref 0.0–0.3)
Total Bilirubin: 1.1 mg/dL (ref 0.2–1.2)
Total Protein: 7 g/dL (ref 6.0–8.3)

## 2022-03-12 LAB — HEMOGLOBIN A1C: Hgb A1c MFr Bld: 5.7 % (ref 4.6–6.5)

## 2022-03-12 LAB — PSA: PSA: 0.86 ng/mL (ref 0.10–4.00)

## 2022-03-12 LAB — TSH: TSH: 2.91 u[IU]/mL (ref 0.35–5.50)

## 2022-03-12 NOTE — Progress Notes (Signed)
Subjective:    Patient ID: Christian Martin, male    DOB: [birth year], 71 y.o.   MRN: [id number]  HPI Here to follow up on issues. He feels well in general. He had nasal surgery a few months ago to straighten his septum and to reduce his turbinates, and this was quite successful. He can now breathe easily through the nose and he sleeps better. He swims laps at the [facility] [facility] 3 days a week. His anxiety is well managed.    Review of Systems  Constitutional: Negative.   HENT: Negative.    Eyes: Negative.   Respiratory: Negative.    Cardiovascular: Negative.   Gastrointestinal: Negative.   Genitourinary: Negative.   Musculoskeletal: Negative.   Skin: Negative.   Neurological: Negative.   Psychiatric/Behavioral: Negative.         Objective:   Physical Exam Constitutional:      General: He is not in acute distress.    Appearance: Normal appearance. He is well-developed. He is not diaphoretic.  HENT:     Head: Normocephalic and atraumatic.     Right Ear: External ear normal.     Left Ear: External ear normal.     Nose: Nose normal.     Mouth/Throat:     Pharynx: No oropharyngeal exudate.  Eyes:     General: No scleral icterus.       Right eye: No discharge.        Left eye: No discharge.     Conjunctiva/sclera: Conjunctivae normal.     Pupils: Pupils are equal, round, and reactive to light.  Neck:     Thyroid: No thyromegaly.     Vascular: No JVD.     Trachea: No tracheal deviation.  Cardiovascular:     Rate and Rhythm: Normal rate and regular rhythm.     Heart sounds: Normal heart sounds. No murmur heard.    No friction rub. No gallop.  Pulmonary:     Effort: Pulmonary effort is normal. No respiratory distress.     Breath sounds: Normal breath sounds. No wheezing or rales.  Chest:     Chest wall: No tenderness.  Abdominal:     General: Bowel sounds are normal. There is no distension.     Palpations: Abdomen is soft. There is no mass.     Tenderness: There is no abdominal  tenderness. There is no guarding or rebound.  Genitourinary:    Penis: Normal. No tenderness.      Testes: Normal.     Prostate: Normal.     Rectum: Normal. Guaiac result negative.  Musculoskeletal:        General: No tenderness. Normal range of motion.     Cervical back: Neck supple.  Lymphadenopathy:     Cervical: No cervical adenopathy.  Skin:    General: Skin is warm and dry.     Coloration: Skin is not pale.     Findings: No erythema or rash.  Neurological:     Mental Status: He is alert and oriented to person, place, and time.     Cranial Nerves: No cranial nerve deficit.     Motor: No abnormal muscle tone.     Coordination: Coordination normal.     Deep Tendon Reflexes: Reflexes are normal and symmetric. Reflexes normal.  Psychiatric:        Behavior: Behavior normal.        Thought Content: Thought content normal.        Judgment: Judgment normal.  Assessment & Plan:  He has recovered well from his nasal surgery. His allergies and anxiety are well controlled. His neuropathy is stable, and his OTC supplements seem to help. Get fasting labs to check lipids, etc. We spent a total of (31   ) minutes reviewing records and discussing these issues.  Alysia Penna, MD

## 2022-05-16 IMAGING — US US ABDOMEN COMPLETE
1 series · 14 of 25 positions shown · non-contrast
Comparison: Cardiac CT 04/03/2021.

CLINICAL DATA: Nausea x3 weeks

EXAM:
ABDOMEN ULTRASOUND COMPLETE

[Series 1: us abdomen complete · 0.18mm/px · 14 of 100 slices shown]
[im 1/100]
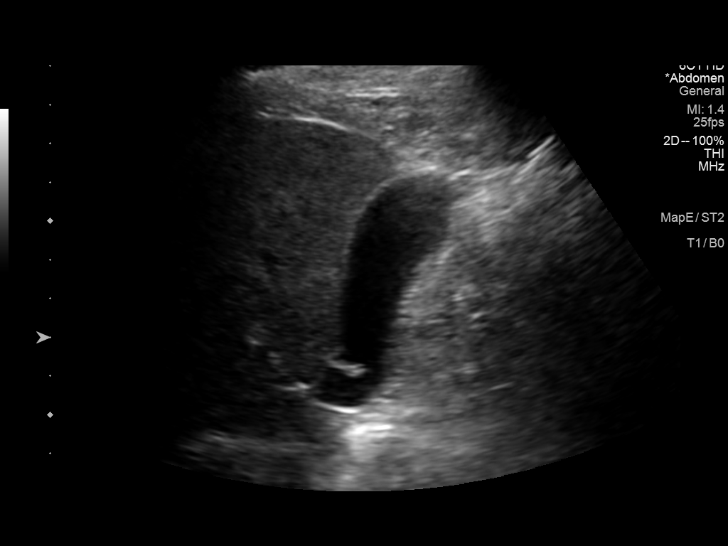
[im 9/100]
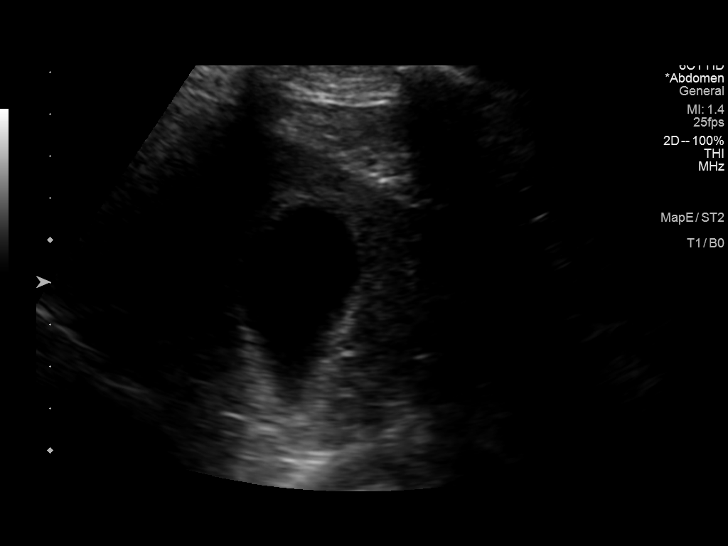
[im 17/100]
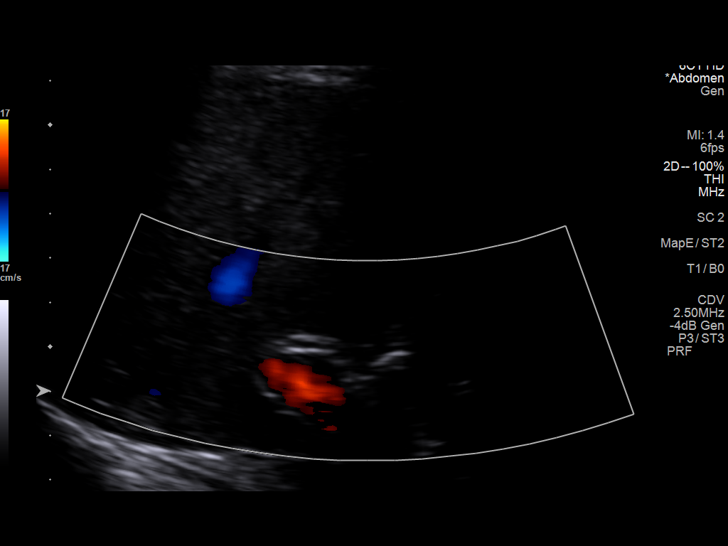
[im 25/100]
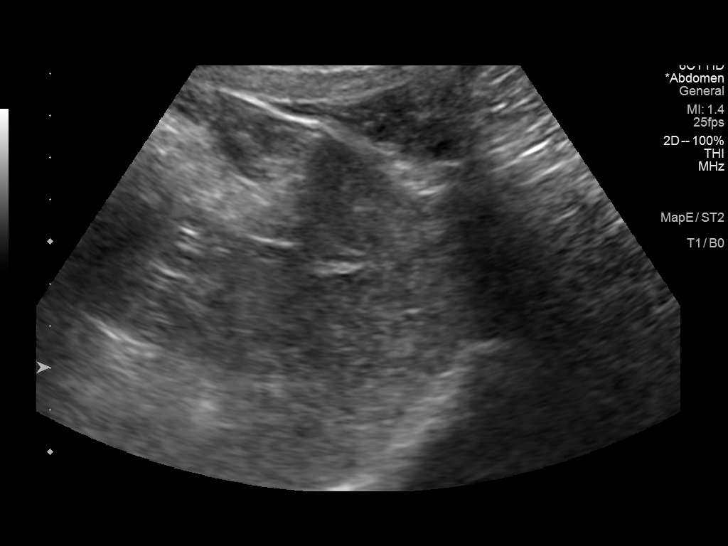
[im 34/100]
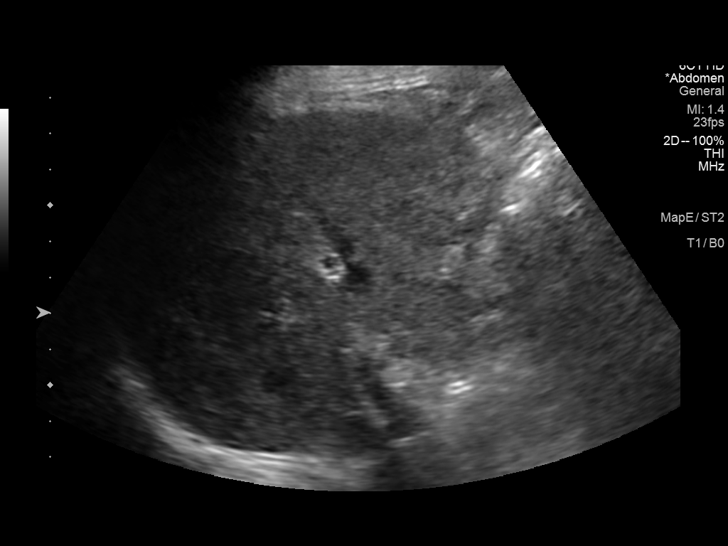
[im 38/100]
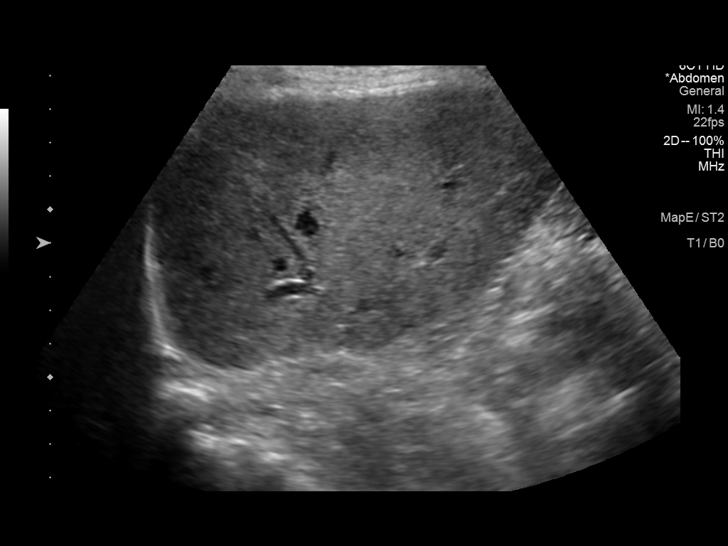
[im 46/100]
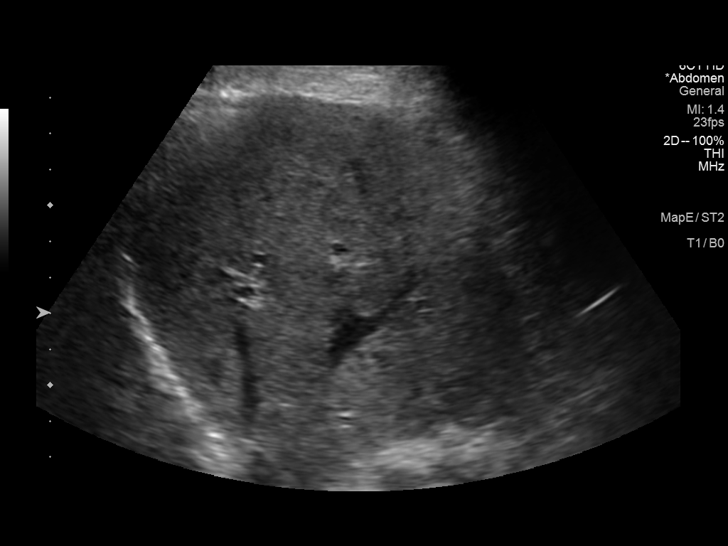
[im 54/100]
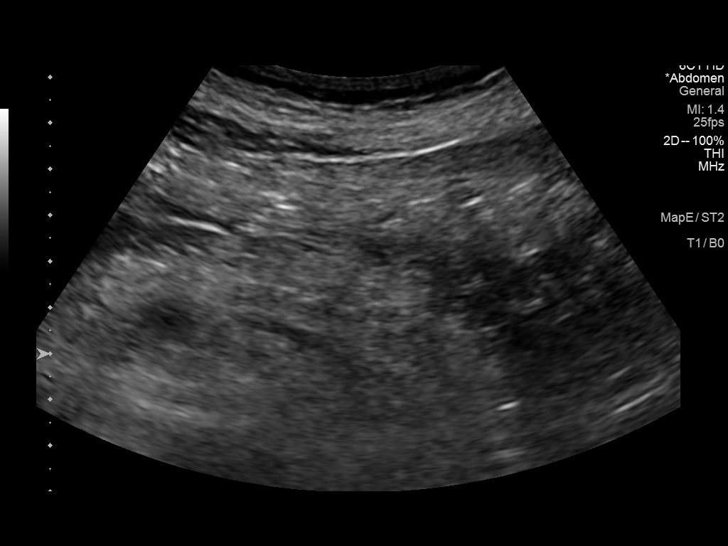
[im 62/100]
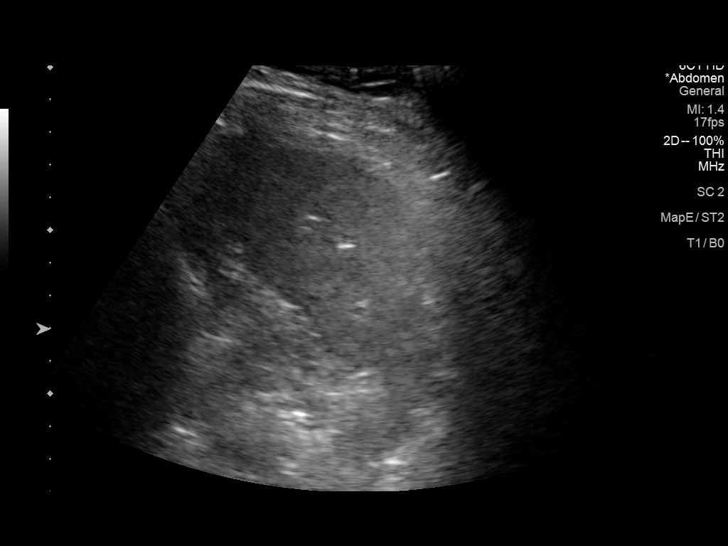
[im 67/100]
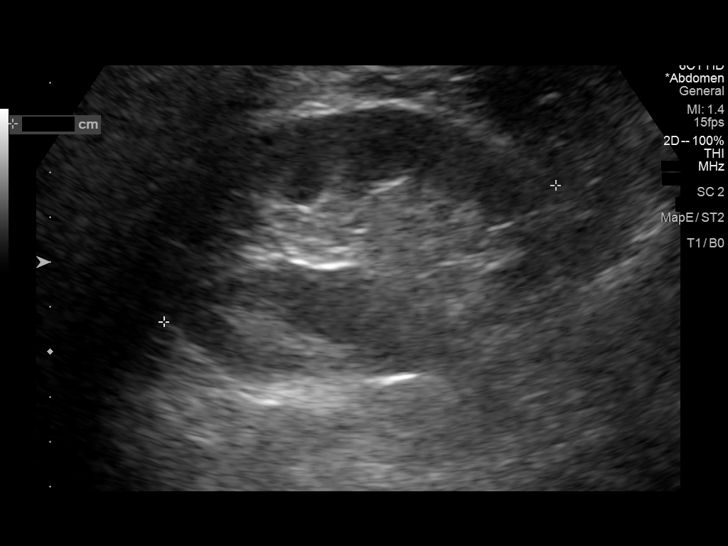
[im 75/100]
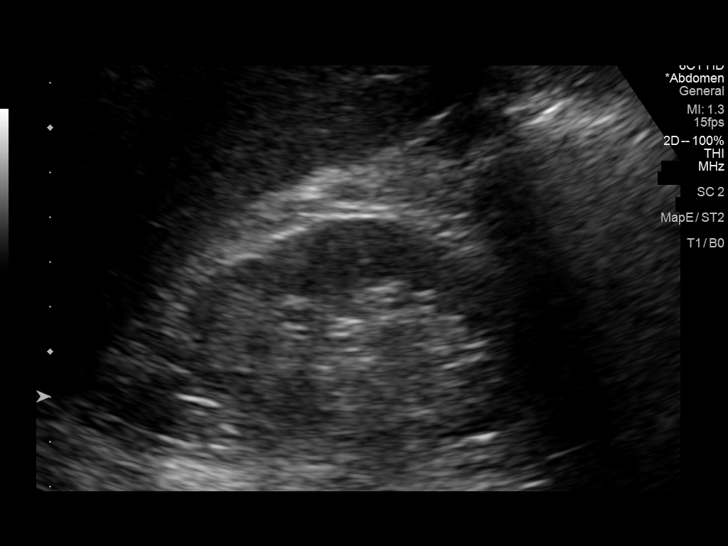
[im 83/100]
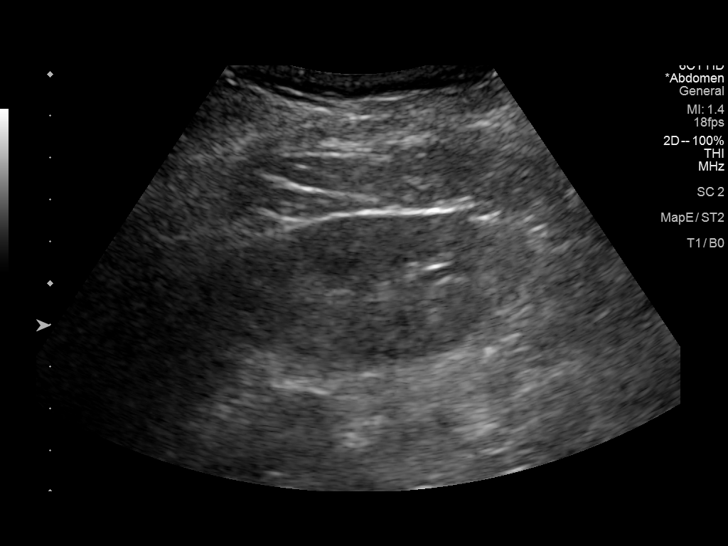
[im 91/100]
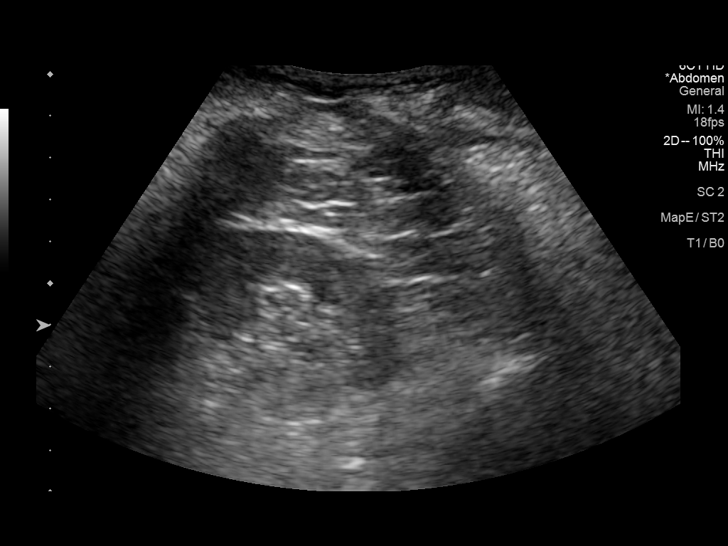
[im 100/100]
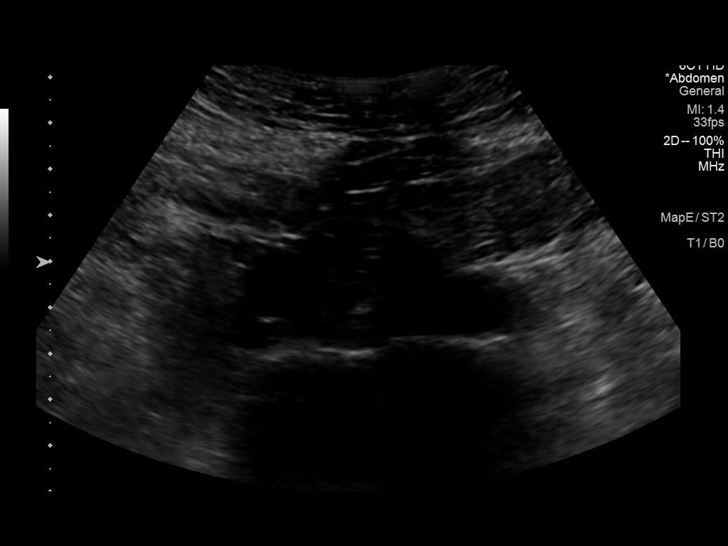

[14 of 25 positions shown; findings below may reference images not displayed]

FINDINGS: Gallbladder: No gallstones or wall thickening visualized. No
sonographic Murphy sign noted by sonographer.

Common bile duct: Diameter: 3 mm

Liver: No focal lesion identified. Heterogeneously increased
parenchymal echogenicity. Portal vein is patent on color Doppler
imaging with normal direction of blood flow towards the liver.

IVC: No abnormality visualized.

Pancreas: Visualized portion unremarkable.

Spleen: Size and appearance within normal limits.

Right Kidney: Length: 9.3 cm. Echogenicity within normal limits.
cm renal cyst. No hydronephrosis visualized.

Left Kidney: Length: 9.4 cm. Echogenicity within normal limits. No
mass or hydronephrosis visualized.

Abdominal aorta: No aneurysm visualized.

Other findings: None.
IMPRESSION: 1. No cholelithiasis or evidence of acute cholecystitis
2. Heterogeneously increased parenchymal echogenicity of the liver,
most compatible with fatty infiltration of the liver.

## 2022-05-19 ENCOUNTER — Encounter: Payer: Self-pay | Admitting: Family Medicine

## 2022-05-21 NOTE — Telephone Encounter (Signed)
Thanks for the update. Please enter these in his chart

## 2022-07-22 ENCOUNTER — Encounter: Payer: Self-pay | Admitting: Family Medicine

## 2022-07-23 NOTE — Telephone Encounter (Signed)
Tamiflu is only effective if taken within 5 days of the start of symptoms

## 2022-07-23 NOTE — Telephone Encounter (Signed)
We would need to evaluate him before prescribing this medication

## 2022-07-25 DIAGNOSIS — R0981 Nasal congestion: Secondary | ICD-10-CM | POA: Diagnosis not present

## 2022-07-25 DIAGNOSIS — R059 Cough, unspecified: Secondary | ICD-10-CM | POA: Diagnosis not present

## 2022-07-25 DIAGNOSIS — J101 Influenza due to other identified influenza virus with other respiratory manifestations: Secondary | ICD-10-CM | POA: Diagnosis not present

## 2022-07-25 DIAGNOSIS — R5383 Other fatigue: Secondary | ICD-10-CM | POA: Diagnosis not present

## 2022-07-25 DIAGNOSIS — R509 Fever, unspecified: Secondary | ICD-10-CM | POA: Diagnosis not present

## 2022-07-29 ENCOUNTER — Ambulatory Visit: Payer: Medicare HMO | Admitting: Family Medicine

## 2022-08-07 ENCOUNTER — Encounter: Payer: Self-pay | Admitting: Family Medicine

## 2022-09-19 ENCOUNTER — Ambulatory Visit (INDEPENDENT_AMBULATORY_CARE_PROVIDER_SITE_OTHER): Payer: Medicare HMO

## 2022-09-19 VITALS — Ht 69.0 in | Wt 156.0 lb

## 2022-09-19 DIAGNOSIS — Z0000 Encounter for general adult medical examination without abnormal findings: Secondary | ICD-10-CM | POA: Diagnosis not present

## 2022-09-19 NOTE — Patient Instructions (Addendum)
Christian Martin , Thank you for taking time to come for your Medicare Wellness Visit. I appreciate your ongoing commitment to your health goals. Please review the following plan we discussed and let me know if I can assist you in the future.   These are the goals we discussed:  Goals       Exercise 150 minutes per week (moderate activity)      Try core work; yoga or other.      patient      Continue to work on brain function and work word puzzles.         Stay Healthy (pt-stated)        This is a list of the screening recommended for you and due dates:  Health Maintenance  Topic Date Due   COVID-19 Vaccine (7 - 2023-24 season) 10/05/2022*   Medicare Annual Wellness Visit  09/20/2023   DTaP/Tdap/Td vaccine (2 - Td or Tdap) 05/06/2026   Colon Cancer Screening  11/08/2029   Pneumonia Vaccine  Completed   Flu Shot  Completed   Hepatitis C Screening: USPSTF Recommendation to screen - Ages 18-79 yo.  Completed   Zoster (Shingles) Vaccine  Completed   HPV Vaccine  Aged Out  *Topic was postponed. The date shown is not the original due date.    Advanced directives: In Chart  Conditions/risks identified: None  Next appointment: Follow up in one year for your annual wellness visit.    Preventive Care 40 Years and Older, Male  Preventive care refers to lifestyle choices and visits with your health care provider that can promote health and wellness. What does preventive care include? A yearly physical exam. This is also called an annual well check. Dental exams once or twice a year. Routine eye exams. Ask your health care provider how often you should have your eyes checked. Personal lifestyle choices, including: Daily care of your teeth and gums. Regular physical activity. Eating a healthy diet. Avoiding tobacco and drug use. Limiting alcohol use. Practicing safe sex. Taking low doses of aspirin every day. Taking vitamin and mineral supplements as recommended by your health  care provider. What happens during an annual well check? The services and screenings done by your health care provider during your annual well check will depend on your age, overall health, lifestyle risk factors, and family history of disease. Counseling  Your health care provider may ask you questions about your: Alcohol use. Tobacco use. Drug use. Emotional well-being. Home and relationship well-being. Sexual activity. Eating habits. History of falls. Memory and ability to understand (cognition). Work and work Statistician. Screening  You may have the following tests or measurements: Height, weight, and BMI. Blood pressure. Lipid and cholesterol levels. These may be checked every 5 years, or more frequently if you are over 21 years old. Skin check. Lung cancer screening. You may have this screening every year starting at age 31 if you have a 30-pack-year history of smoking and currently smoke or have quit within the past 15 years. Fecal occult blood test (FOBT) of the stool. You may have this test every year starting at age 76. Flexible sigmoidoscopy or colonoscopy. You may have a sigmoidoscopy every 5 years or a colonoscopy every 10 years starting at age 52. Prostate cancer screening. Recommendations will vary depending on your family history and other risks. Hepatitis C blood test. Hepatitis B blood test. Sexually transmitted disease (STD) testing. Diabetes screening. This is done by checking your blood sugar (glucose) after you have not  eaten for a while (fasting). You may have this done every 1-3 years. Abdominal aortic aneurysm (AAA) screening. You may need this if you are a current or former smoker. Osteoporosis. You may be screened starting at age 39 if you are at high risk. Talk with your health care provider about your test results, treatment options, and if necessary, the need for more tests. Vaccines  Your health care provider may recommend certain vaccines, such  as: Influenza vaccine. This is recommended every year. Tetanus, diphtheria, and acellular pertussis (Tdap, Td) vaccine. You may need a Td booster every 10 years. Zoster vaccine. You may need this after age 73. Pneumococcal 13-valent conjugate (PCV13) vaccine. One dose is recommended after age 74. Pneumococcal polysaccharide (PPSV23) vaccine. One dose is recommended after age 51. Talk to your health care provider about which screenings and vaccines you need and how often you need them. This information is not intended to replace advice given to you by your health care provider. Make sure you discuss any questions you have with your health care provider. Document Released: 09/08/2015 Document Revised: 05/01/2016 Document Reviewed: 06/13/2015 Elsevier Interactive Patient Education  2017 [employer] Prevention in the Home Falls can cause injuries. They can happen to people of all ages. There are many things you can do to make your home safe and to help prevent falls. What can I do on the outside of my home? Regularly fix the edges of walkways and driveways and fix any cracks. Remove anything that might make you trip as you walk through a door, such as a raised step or threshold. Trim any bushes or trees on the path to your home. Use bright outdoor lighting. Clear any walking paths of anything that might make someone trip, such as rocks or tools. Regularly check to see if handrails are loose or broken. Make sure that both sides of any steps have handrails. Any raised decks and porches should have guardrails on the edges. Have any leaves, snow, or ice cleared regularly. Use sand or salt on walking paths during winter. Clean up any spills in your garage right away. This includes oil or grease spills. What can I do in the bathroom? Use night lights. Install grab bars by the toilet and in the tub and shower. Do not use towel bars as grab bars. Use non-skid mats or decals in the tub or  shower. If you need to sit down in the shower, use a plastic, non-slip stool. Keep the floor dry. Clean up any water that spills on the floor as soon as it happens. Remove soap buildup in the tub or shower regularly. Attach bath mats securely with double-sided non-slip rug tape. Do not have throw rugs and other things on the floor that can make you trip. What can I do in the bedroom? Use night lights. Make sure that you have a light by your bed that is easy to reach. Do not use any sheets or blankets that are too big for your bed. They should not hang down onto the floor. Have a firm chair that has side arms. You can use this for support while you get dressed. Do not have throw rugs and other things on the floor that can make you trip. What can I do in the kitchen? Clean up any spills right away. Avoid walking on wet floors. Keep items that you use a lot in easy-to-reach places. If you need to reach something above you, use a strong step stool that  has a grab bar. Keep electrical cords out of the way. Do not use floor polish or wax that makes floors slippery. If you must use wax, use non-skid floor wax. Do not have throw rugs and other things on the floor that can make you trip. What can I do with my stairs? Do not leave any items on the stairs. Make sure that there are handrails on both sides of the stairs and use them. Fix handrails that are broken or loose. Make sure that handrails are as long as the stairways. Check any carpeting to make sure that it is firmly attached to the stairs. Fix any carpet that is loose or worn. Avoid having throw rugs at the top or bottom of the stairs. If you do have throw rugs, attach them to the floor with carpet tape. Make sure that you have a light switch at the top of the stairs and the bottom of the stairs. If you do not have them, ask someone to add them for you. What else can I do to help prevent falls? Wear shoes that: Do not have high heels. Have  rubber bottoms. Are comfortable and fit you well. Are closed at the toe. Do not wear sandals. If you use a stepladder: Make sure that it is fully opened. Do not climb a closed stepladder. Make sure that both sides of the stepladder are locked into place. Ask someone to hold it for you, if possible. Clearly mark and make sure that you can see: Any grab bars or handrails. First and last steps. Where the edge of each step is. Use tools that help you move around (mobility aids) if they are needed. These include: Canes. Walkers. Scooters. Crutches. Turn on the lights when you go into a dark area. Replace any light bulbs as soon as they burn out. Set up your furniture so you have a clear path. Avoid moving your furniture around. If any of your floors are uneven, fix them. If there are any pets around you, be aware of where they are. Review your medicines with your doctor. Some medicines can make you feel dizzy. This can increase your chance of falling. Ask your doctor what other things that you can do to help prevent falls. This information is not intended to replace advice given to you by your health care provider. Make sure you discuss any questions you have with your health care provider. Document Released: 06/08/2009 Document Revised: 01/18/2016 Document Reviewed: 09/16/2014 Elsevier Interactive Patient Education  2017 [employer].

## 2022-09-19 NOTE — Progress Notes (Signed)
Subjective:   Christian Martin is a 72 y.o. male who presents for Medicare Annual/Subsequent preventive examination.  Review of Systems    Virtual Visit via Telephone Note  I connected with  Christian Martin on 09/19/22 at 12:30 PM EST by telephone and verified that I am speaking with the correct person using two identifiers.  Location: Patient: Home Provider: Office Persons participating in the virtual visit: patient/Nurse Health Advisor   I discussed the limitations, risks, security and privacy concerns of performing an evaluation and management service by telephone and the availability of in person appointments. The patient expressed understanding and agreed to proceed.  Interactive audio and video telecommunications were attempted between this nurse and patient, however failed, due to patient having technical difficulties OR patient did not have access to video capability.  We continued and completed visit with audio only.  Some vital signs may be absent or patient reported.   [name], LPN  Cardiac Risk Factors include: advanced age (>52mn, >>53women);dyslipidemia;male gender     Objective:    Today's Vitals   09/19/22 1238  Weight: 156 lb (70.8 kg)  Height: '5\' 9"'$  (1.753 m)   Body mass index is 23.04 kg/m.     09/19/2022   12:44 PM 09/10/2021    9:30 AM 09/12/2020    2:21 PM 06/21/2016   10:19 AM  Advanced Directives  Does Patient Have a Medical Advance Directive? Yes Yes Yes No  Type of AParamedicof AGlen FerrisLiving will HRio GrandeLiving will H[place]Living will   Does patient want to make changes to medical advance directive? No - Patient declined No - Patient declined No - Patient declined   Copy of [place] Chart? Yes - validated most recent copy scanned in chart (See row information) No - copy requested Yes - validated most recent copy scanned in chart (See row  information)   Would patient like information on creating a medical advance directive?    No - patient declined information    Current Medications (verified) Outpatient Encounter Medications as of 09/19/2022  Medication Sig   ALFALFA PO Take by mouth daily. 2.4 grams   APPLE CIDER VINEGAR PO Take by mouth daily.   Cholecalciferol (VITAMIN D PO) Take by mouth.   CITRUS BERGAMOT PO Take 200 mg by mouth daily.   ELDERBERRY PO Take by mouth daily.   fish oil-omega-3 fatty acids 1000 MG capsule Take 2 g by mouth daily.   Garlic 10174MG CAPS Take by mouth.   MELATONIN PO Take 1 mg by mouth at bedtime as needed.   MILK THISTLE PO Take by mouth daily.   Probiotic Product (PROBIOTIC PO) Take by mouth daily.   ROYAL JELLY PO Take by mouth daily.   sodium chloride (OCEAN) 0.65 % SOLN nasal spray Place 1 spray into both nostrils as needed for congestion.   UBIQUINOL PO Take by mouth daily.   No facility-administered encounter medications on file as of 09/19/2022.    Allergies (verified) Penicillins and Trazodone and nefazodone   History: Past Medical History:  Diagnosis Date   Hx of adenomatous colonic polyps    Hyperlipidemia    Retinal tear of right eye 2016   saw Dr. LPrudencio Burly   Past Surgical History:  Procedure Laterality Date   COLONOSCOPY  11/09/2019   per Dr. AHavery Moros adenomatous polyp, repeat in 7 yrs    LACERATION REPAIR  Please update my chart: 01-18-18 Lacerated forehead wound 1.5" treated with 6 sutures at [facility] on [address].    NASAL SEPTOPLASTY W/ TURBINOPLASTY Bilateral 11/09/2021   per Dr. Dorothe Pea in [place]     Family History  Problem Relation Age of Onset   Cancer Sister        breast   Osteoporosis Mother    Hypertension Mother    Heart failure Father    Breast cancer Other    Coronary artery disease Other    Colon cancer Neg Hx    Esophageal cancer Neg Hx    Rectal cancer Neg Hx    Stomach cancer Neg Hx    Social  History   Socioeconomic History   Marital status: Single    Spouse name: Not on file   Number of children: Not on file   Years of education: Not on file   Highest education level: Not on file  Occupational History   Not on file  Tobacco Use   Smoking status: Never   Smokeless tobacco: Never  Substance and Sexual Activity   Alcohol use: Not Currently    Alcohol/week: 0.0 standard drinks of alcohol    Comment: glass of wine each day   Drug use: No   Sexual activity: Not on file  Other Topics Concern   Not on file  Social History Narrative   Not on file   Social Determinants of Health   Financial Resource Strain: Low Risk  (09/19/2022)   Overall Financial Resource Strain (CARDIA)    Difficulty of Paying Living Expenses: Not hard at all  Food Insecurity: No Food Insecurity (09/19/2022)   Hunger Vital Sign    Worried About Running Out of Food in the Last Year: Never true    Ran Out of Food in the Last Year: Never true  Transportation Needs: No Transportation Needs (09/19/2022)   PRAPARE - Hydrologist (Medical): No    Lack of Transportation (Non-Medical): No  Physical Activity: Insufficiently Active (09/19/2022)   Exercise Vital Sign    Days of Exercise per Week: 3 days    Minutes of Exercise per Session: 30 min  Stress: No Stress Concern Present (09/19/2022)   [place]    Feeling of Stress : Not at all  Social Connections: Moderately Isolated (09/19/2022)   Social Connection and Isolation Panel [NHANES]    Frequency of Communication with Friends and Family: More than three times a week    Frequency of Social Gatherings with Friends and Family: More than three times a week    Attends Religious Services: Never    Marine scientist or Organizations: No    Attends Music therapist: More than 4 times per year    Marital Status: Never married    Tobacco  Counseling Counseling given: Not Answered   Clinical Intake:  Pre-visit preparation completed: No  Pain : No/denies pain     BMI - recorded: 23.04 Nutritional Status: BMI of 19-24  Normal Nutritional Risks: None Diabetes: No  How often do you need to have someone help you when you read instructions, pamphlets, or other written materials from your doctor or pharmacy?: 1 - Never  Diabetic?  No  Interpreter Needed?: No  Information entered by :: Rolene Arbour LPN   Activities of Daily Living    09/19/2022   12:43 PM  In your present state  of health, do you have any difficulty performing the following activities:  Hearing? 0  Vision? 0  Difficulty concentrating or making decisions? 0  Walking or climbing stairs? 0  Dressing or bathing? 0  Doing errands, shopping? 0  Preparing Food and eating ? N  Using the Toilet? N  In the past six months, have you accidently leaked urine? N  Do you have problems with loss of bowel control? N  Managing your Medications? N  Managing your Finances? N  Housekeeping or managing your Housekeeping? N    Patient Care Team: Laurey Morale, MD as PCP - General Kipp Brood, Mariam Dollar, [facility] (Inactive) as Pharmacist (Pharmacist)  Indicate any recent Medical Services you may have received from other than [facility] providers in the past year (date may be approximate).     Assessment:   This is a routine wellness examination for Mann.  Hearing/Vision screen Hearing Screening - Comments:: Denies hearing difficulties   Vision Screening - Comments:: Wears rx glasses - up to date with routine eye exams with  [facility]  Dietary issues and exercise activities discussed: Current Exercise Habits: Home exercise routine, Type of exercise: Other - see comments (Swiming), Time (Minutes): 30, Frequency (Times/Week): 3, Weekly Exercise (Minutes/Week): 90, Intensity: Moderate, Exercise limited by: None identified   Goals Addressed               This  Visit's Progress     Stay Healthy (pt-stated)         Depression Screen    09/19/2022   12:42 PM 03/12/2022    8:32 AM 09/10/2021    9:24 AM 03/09/2021    8:46 AM 09/12/2020    2:23 PM 06/21/2016   10:33 AM 01/12/2015    9:07 AM  PHQ 2/9 Scores  PHQ - 2 Score 0 1 0 1 0 0 0  PHQ- 9 Score  3  5       Fall Risk    09/19/2022   12:44 PM 03/12/2022    8:32 AM 09/10/2021    9:27 AM 09/07/2021    7:28 PM 03/09/2021    8:45 AM  Fall Risk   Falls in the past year? 0 0 0 0 0  Number falls in past yr: 0 0 0 0 0  Injury with Fall? 0 0 0 0 0  Risk for fall due to : No Fall Risks No Fall Risks     Follow up Falls prevention discussed Falls evaluation completed       FALL RISK PREVENTION PERTAINING TO THE HOME:  Any stairs in or around the home? Yes  If so, are there any without handrails? No  Home free of loose throw rugs in walkways, pet beds, electrical cords, etc? Yes  Adequate lighting in your home to reduce risk of falls? Yes   ASSISTIVE DEVICES UTILIZED TO PREVENT FALLS:  Life alert? No  Use of a cane, walker or w/c? No  Grab bars in the bathroom? No  Shower chair or bench in shower? No  Elevated toilet seat or a handicapped toilet? Yes   TIMED UP AND GO:  Was the test performed? No . Audio Visit    Cognitive Function:        09/19/2022   12:44 PM 09/10/2021    9:29 AM  6CIT Screen  What Year? 0 points 0 points  What month? 0 points 0 points  What time? 0 points 0 points  Count back from 20 0 points  0 points  Months in reverse 0 points 0 points  Repeat phrase 0 points 0 points  Total Score 0 points 0 points    Immunizations Immunization History  Administered Date(s) Administered   Fluad Quad(high Dose 65+) 05/24/2021, 05/18/2022   Influenza, High Dose Seasonal PF 05/31/2017, 04/22/2019, 06/11/2020   Influenza,inj,Quad PF,6+ Mos 04/21/2015, 05/06/2016   Influenza-Unspecified 05/31/2017   PFIZER(Purple Top)SARS-COV-2 Vaccination 09/30/2019, 10/25/2019,  05/28/2020, 12/26/2020   Pfizer Covid-19 Vaccine Bivalent Booster 57yr & up 05/07/2021, 05/18/2022   Pneumococcal Conjugate-13 01/10/2014   Pneumococcal Polysaccharide-23 04/21/2015   Rsv, Bivalent, Protein Subunit Rsvpref,pf (Evans Lance 08/05/2022   Tdap 05/06/2016   Zoster Recombinat (Shingrix) 03/08/2019, 05/10/2019   Zoster, Live 01/08/2013    TDAP status: Up to date  Flu Vaccine status: Up to date  Pneumococcal vaccine status: Up to date  Covid-19 vaccine status: Completed vaccines  Qualifies for Shingles Vaccine? Yes   Zostavax completed Yes   Shingrix Completed?: Yes  Screening Tests Health Maintenance  Topic Date Due   COVID-19 Vaccine (7 - 2023-24 season) 10/05/2022 (Originally 07/13/2022)   Medicare Annual Wellness (AWV)  09/20/2023   DTaP/Tdap/Td (2 - Td or Tdap) 05/06/2026   COLONOSCOPY (Pts [id number] coverage will need to be confirmed)  11/08/2029   Pneumonia Vaccine 6554Years old  Completed   INFLUENZA VACCINE  Completed   Hepatitis C Screening  Completed   Zoster Vaccines- Shingrix  Completed   HPV VACCINES  Aged Out    Health Maintenance  There are no preventive care reminders to display for this patient.   Colorectal cancer screening: Type of screening: Colonoscopy. Completed 11/09/19. Repeat every 10 years  Lung Cancer Screening: (Low Dose CT Chest recommended if Age 74-80ears, 30 pack-year currently smoking OR have quit w/in 15years.) does not qualify.     Additional Screening:  Hepatitis C Screening: does qualify; Completed 01/03/17  Vision Screening: Recommended annual ophthalmology exams for early detection of glaucoma and other disorders of the eye. Is the patient up to date with their annual eye exam?  Yes  Who is the provider or what is the name of the office in which the patient attends annual eye exams? [facility] is not established with a provider, would they like to be referred to a provider to establish care? No .    Dental Screening: Recommended annual dental exams for proper oral hygiene  Community Resource Referral / Chronic Care Management:  CRR required this visit?  No   CCM required this visit?  No      Plan:     I have personally reviewed and noted the following in the patient's chart:   Medical and social history Use of alcohol, tobacco or illicit drugs  Current medications and supplements including opioid prescriptions. Patient is not currently taking opioid prescriptions. Functional ability and status Nutritional status Physical activity Advanced directives List of other physicians Hospitalizations, surgeries, and ER visits in previous 12 months Vitals Screenings to include cognitive, depression, and falls Referrals and appointments  In addition, I have reviewed and discussed with patient certain preventive protocols, quality metrics, and best practice recommendations. A written personalized care plan for preventive services as well as general preventive health recommendations were provided to patient.   09/19/2022   Nurse Notes: Patient reported continues to have sleep issues.

## 2023-01-01 DIAGNOSIS — H2513 Age-related nuclear cataract, bilateral: Secondary | ICD-10-CM | POA: Diagnosis not present

## 2023-01-01 DIAGNOSIS — H43393 Other vitreous opacities, bilateral: Secondary | ICD-10-CM | POA: Diagnosis not present

## 2023-01-01 DIAGNOSIS — H524 Presbyopia: Secondary | ICD-10-CM | POA: Diagnosis not present

## 2023-01-07 DIAGNOSIS — Z0100 Encounter for examination of eyes and vision without abnormal findings: Secondary | ICD-10-CM | POA: Diagnosis not present

## 2023-02-11 DIAGNOSIS — G629 Polyneuropathy, unspecified: Secondary | ICD-10-CM | POA: Diagnosis not present

## 2023-02-11 DIAGNOSIS — Z803 Family history of malignant neoplasm of breast: Secondary | ICD-10-CM | POA: Diagnosis not present

## 2023-02-11 DIAGNOSIS — Z008 Encounter for other general examination: Secondary | ICD-10-CM | POA: Diagnosis not present

## 2023-02-11 DIAGNOSIS — Z8249 Family history of ischemic heart disease and other diseases of the circulatory system: Secondary | ICD-10-CM | POA: Diagnosis not present

## 2023-02-11 DIAGNOSIS — Z880 Allergy status to penicillin: Secondary | ICD-10-CM | POA: Diagnosis not present

## 2023-02-11 DIAGNOSIS — Z825 Family history of asthma and other chronic lower respiratory diseases: Secondary | ICD-10-CM | POA: Diagnosis not present

## 2023-02-11 DIAGNOSIS — M1990 Unspecified osteoarthritis, unspecified site: Secondary | ICD-10-CM | POA: Diagnosis not present

## 2023-03-14 ENCOUNTER — Ambulatory Visit (INDEPENDENT_AMBULATORY_CARE_PROVIDER_SITE_OTHER): Payer: Medicare HMO | Admitting: Family Medicine

## 2023-03-14 ENCOUNTER — Encounter: Payer: Self-pay | Admitting: Family Medicine

## 2023-03-14 ENCOUNTER — Telehealth: Payer: Self-pay | Admitting: Family Medicine

## 2023-03-14 VITALS — BP 126/88 | HR 66 | Temp 97.9°F | Ht 68.25 in | Wt 163.0 lb

## 2023-03-14 DIAGNOSIS — J3089 Other allergic rhinitis: Secondary | ICD-10-CM

## 2023-03-14 DIAGNOSIS — N401 Enlarged prostate with lower urinary tract symptoms: Secondary | ICD-10-CM | POA: Diagnosis not present

## 2023-03-14 DIAGNOSIS — F419 Anxiety disorder, unspecified: Secondary | ICD-10-CM

## 2023-03-14 DIAGNOSIS — N138 Other obstructive and reflux uropathy: Secondary | ICD-10-CM

## 2023-03-14 DIAGNOSIS — J3489 Other specified disorders of nose and nasal sinuses: Secondary | ICD-10-CM | POA: Diagnosis not present

## 2023-03-14 DIAGNOSIS — G629 Polyneuropathy, unspecified: Secondary | ICD-10-CM | POA: Diagnosis not present

## 2023-03-14 DIAGNOSIS — R739 Hyperglycemia, unspecified: Secondary | ICD-10-CM | POA: Diagnosis not present

## 2023-03-14 DIAGNOSIS — M65332 Trigger finger, left middle finger: Secondary | ICD-10-CM | POA: Diagnosis not present

## 2023-03-14 DIAGNOSIS — E785 Hyperlipidemia, unspecified: Secondary | ICD-10-CM

## 2023-03-14 NOTE — Progress Notes (Signed)
Subjective:    Patient ID: Christian Martin, male    DOB: April 19, 1951, 72 y.o.   MRN: [id number]  HPI Here to follow up on issues. In general he feels well. He swims 3 days a week for exercise. The neuropathy in his feet is stable. He has numbness that comes and goes, but there is no pain. He developed a trigger finger in the left 3rd finger a few months ago, but he is no ready to have this fixed yet. He noticed a non-tender lump in the left nostril about a year ago. This does not seem to be changing.    Review of Systems  Constitutional: Negative.   HENT: Negative.    Eyes: Negative.   Respiratory: Negative.    Cardiovascular: Negative.   Gastrointestinal: Negative.   Genitourinary: Negative.   Musculoskeletal: Negative.   Skin: Negative.   Neurological:  Positive for numbness.  Psychiatric/Behavioral: Negative.         Objective:   Physical Exam Constitutional:      General: He is not in acute distress.    Appearance: Normal appearance. He is well-developed. He is not diaphoretic.  HENT:     Head: Normocephalic and atraumatic.     Right Ear: External ear normal.     Left Ear: External ear normal.     Nose:     Comments: The left nostril has a 3 mm papular lesion on the septal side that has raised borders     Mouth/Throat:     Pharynx: No oropharyngeal exudate.  Eyes:     General: No scleral icterus.       Right eye: No discharge.        Left eye: No discharge.     Conjunctiva/sclera: Conjunctivae normal.     Pupils: Pupils are equal, round, and reactive to light.  Neck:     Thyroid: No thyromegaly.     Vascular: No JVD.     Trachea: No tracheal deviation.  Cardiovascular:     Rate and Rhythm: Normal rate and regular rhythm.     Pulses: Normal pulses.     Heart sounds: Normal heart sounds. No murmur heard.    No friction rub. No gallop.  Pulmonary:     Effort: Pulmonary effort is normal. No respiratory distress.     Breath sounds: Normal breath sounds. No  wheezing or rales.  Chest:     Chest wall: No tenderness.  Abdominal:     General: Bowel sounds are normal. There is no distension.     Palpations: Abdomen is soft. There is no mass.     Tenderness: There is no abdominal tenderness. There is no guarding or rebound.  Genitourinary:    Penis: Normal. No tenderness.      Testes: Normal.     Prostate: Normal.     Rectum: Normal. Guaiac result negative.  Musculoskeletal:        General: No tenderness. Normal range of motion.     Cervical back: Neck supple.     Comments: The left 3rd finger does have a slight catch when he extends it, but this is easily overcome   Lymphadenopathy:     Cervical: No cervical adenopathy.  Skin:    General: Skin is warm and dry.     Coloration: Skin is not pale.     Findings: No erythema or rash.  Neurological:     General: No focal deficit present.     Mental Status: He is  alert and oriented to person, place, and time.     Cranial Nerves: No cranial nerve deficit.     Motor: No abnormal muscle tone.     Coordination: Coordination normal.     Deep Tendon Reflexes: Reflexes are normal and symmetric. Reflexes normal.  Psychiatric:        Behavior: Behavior normal.        Thought Content: Thought content normal.        Judgment: Judgment normal.           Assessment & Plan:  His neuropathy is stable. His allergies and BPH are stable. We agreed to watch the trigger finger for now, and he will let us know if it gets worse. The nasal lesion is suggestive of a basal cell cancer, so we will refer him to ENT to evaluate. Get fasting labs to check lipids, PSA, etc. We spent a total of (34   ) minutes reviewing records and discussing these issues.  Gershon Crane, MD

## 2023-03-14 NOTE — Telephone Encounter (Signed)
Pt would like to speak with nancy pt had nasal surgery at [facility] in [place] and he would like to make sure dr Suszanne Conners will have those records

## 2023-03-17 ENCOUNTER — Other Ambulatory Visit (INDEPENDENT_AMBULATORY_CARE_PROVIDER_SITE_OTHER): Payer: Medicare HMO

## 2023-03-17 DIAGNOSIS — N401 Enlarged prostate with lower urinary tract symptoms: Secondary | ICD-10-CM | POA: Diagnosis not present

## 2023-03-17 DIAGNOSIS — E785 Hyperlipidemia, unspecified: Secondary | ICD-10-CM

## 2023-03-17 DIAGNOSIS — N138 Other obstructive and reflux uropathy: Secondary | ICD-10-CM | POA: Diagnosis not present

## 2023-03-17 DIAGNOSIS — R739 Hyperglycemia, unspecified: Secondary | ICD-10-CM | POA: Diagnosis not present

## 2023-03-17 LAB — HEMOGLOBIN A1C: Hgb A1c MFr Bld: 5.5 % (ref 4.6–6.5)

## 2023-03-17 LAB — HEPATIC FUNCTION PANEL
ALT: 17 U/L (ref 0–53)
AST: 21 U/L (ref 0–37)
Albumin: 4.4 g/dL (ref 3.5–5.2)
Alkaline Phosphatase: 66 U/L (ref 39–117)
Bilirubin, Direct: 0.2 mg/dL (ref 0.0–0.3)
Total Bilirubin: 1.5 mg/dL — ABNORMAL HIGH (ref 0.2–1.2)
Total Protein: 6.8 g/dL (ref 6.0–8.3)

## 2023-03-17 LAB — CBC WITH DIFFERENTIAL/PLATELET
Basophils Absolute: 0 10*3/uL (ref 0.0–0.1)
Basophils Relative: 0.6 % (ref 0.0–3.0)
Eosinophils Absolute: 0 10*3/uL (ref 0.0–0.7)
Eosinophils Relative: 0.6 % (ref 0.0–5.0)
HCT: 46.7 % (ref 39.0–52.0)
Hemoglobin: 14.9 g/dL (ref 13.0–17.0)
Lymphocytes Relative: 28.4 % (ref 12.0–46.0)
Lymphs Abs: 1.6 10*3/uL (ref 0.7–4.0)
MCHC: 32 g/dL (ref 30.0–36.0)
MCV: 95.4 fl (ref 78.0–100.0)
Monocytes Absolute: 0.4 10*3/uL (ref 0.1–1.0)
Monocytes Relative: 7.5 % (ref 3.0–12.0)
Neutro Abs: 3.5 10*3/uL (ref 1.4–7.7)
Neutrophils Relative %: 62.9 % (ref 43.0–77.0)
Platelets: 202 10*3/uL (ref 150.0–400.0)
RBC: 4.89 Mil/uL (ref 4.22–5.81)
RDW: 12.8 % (ref 11.5–15.5)
WBC: 5.6 10*3/uL (ref 4.0–10.5)

## 2023-03-17 LAB — BASIC METABOLIC PANEL
BUN: 17 mg/dL (ref 6–23)
CO2: 25 mEq/L (ref 19–32)
Calcium: 9.5 mg/dL (ref 8.4–10.5)
Chloride: 104 mEq/L (ref 96–112)
Creatinine, Ser: 1.04 mg/dL (ref 0.40–1.50)
GFR: 70.87 mL/min (ref >60.00)
Glucose, Bld: 91 mg/dL (ref 70–99)
Potassium: 5 mEq/L (ref 3.5–5.1)
Sodium: 139 mEq/L (ref 135–145)

## 2023-03-17 LAB — LIPID PANEL
Cholesterol: 217 mg/dL — ABNORMAL HIGH (ref 0–200)
HDL: 48.4 mg/dL (ref >39.00)
LDL Cholesterol: 138 mg/dL — ABNORMAL HIGH (ref 0–99)
NonHDL: 168.65
Total CHOL/HDL Ratio: 4
Triglycerides: 155 mg/dL — ABNORMAL HIGH (ref 0.0–149.0)
VLDL: 31 mg/dL (ref 0.0–40.0)

## 2023-03-17 LAB — PSA: PSA: 1.15 ng/mL (ref 0.10–4.00)

## 2023-03-17 LAB — TSH: TSH: 1.99 u[IU]/mL (ref 0.35–5.50)

## 2023-03-18 ENCOUNTER — Encounter: Payer: Self-pay | Admitting: Family Medicine

## 2023-03-18 DIAGNOSIS — E785 Hyperlipidemia, unspecified: Secondary | ICD-10-CM

## 2023-03-18 MED ORDER — ATORVASTATIN CALCIUM 20 MG PO TABS: 20.0000 mg | ORAL_TABLET | Freq: Every day | ORAL | 3 refills | Status: DC

## 2023-03-18 NOTE — Telephone Encounter (Signed)
Yes, a statin would be helpful. Call in Lipitor 20 mg daily, #90 with 3 rf. Recheck lipids in 90 days

## 2023-03-20 NOTE — Telephone Encounter (Signed)
Left detailed message for pt advised that Dr Avel Sensor office will request medical records from for pt Nasal surgery from [facility] since they did the surgery.

## 2023-04-01 DIAGNOSIS — J343 Hypertrophy of nasal turbinates: Secondary | ICD-10-CM | POA: Diagnosis not present

## 2023-04-01 DIAGNOSIS — J310 Chronic rhinitis: Secondary | ICD-10-CM | POA: Diagnosis not present

## 2023-04-01 DIAGNOSIS — J342 Deviated nasal septum: Secondary | ICD-10-CM | POA: Diagnosis not present

## 2023-04-15 ENCOUNTER — Encounter: Payer: Self-pay | Admitting: Family Medicine

## 2023-04-15 DIAGNOSIS — M5431 Sciatica, right side: Secondary | ICD-10-CM | POA: Diagnosis not present

## 2023-04-16 ENCOUNTER — Emergency Department (HOSPITAL_COMMUNITY)
Admission: EM | Admit: 2023-04-16 | Discharge: 2023-04-16 | Disposition: A | Discharge status: Home / Self Care | Payer: Medicare HMO | Attending: Emergency Medicine | Admitting: Emergency Medicine

## 2023-04-16 ENCOUNTER — Emergency Department (HOSPITAL_COMMUNITY): Payer: Medicare HMO

## 2023-04-16 DIAGNOSIS — M4727 Other spondylosis with radiculopathy, lumbosacral region: Secondary | ICD-10-CM | POA: Diagnosis not present

## 2023-04-16 DIAGNOSIS — M5431 Sciatica, right side: Secondary | ICD-10-CM

## 2023-04-16 DIAGNOSIS — M4186 Other forms of scoliosis, lumbar region: Secondary | ICD-10-CM | POA: Diagnosis not present

## 2023-04-16 DIAGNOSIS — M4316 Spondylolisthesis, lumbar region: Secondary | ICD-10-CM | POA: Diagnosis not present

## 2023-04-16 DIAGNOSIS — M5441 Lumbago with sciatica, right side: Secondary | ICD-10-CM | POA: Diagnosis not present

## 2023-04-16 DIAGNOSIS — M549 Dorsalgia, unspecified: Secondary | ICD-10-CM | POA: Diagnosis not present

## 2023-04-16 DIAGNOSIS — M545 Low back pain, unspecified: Secondary | ICD-10-CM | POA: Diagnosis not present

## 2023-04-16 DIAGNOSIS — Z743 Need for continuous supervision: Secondary | ICD-10-CM | POA: Diagnosis not present

## 2023-04-16 DIAGNOSIS — R109 Unspecified abdominal pain: Secondary | ICD-10-CM | POA: Diagnosis not present

## 2023-04-16 DIAGNOSIS — M4726 Other spondylosis with radiculopathy, lumbar region: Secondary | ICD-10-CM | POA: Diagnosis not present

## 2023-04-16 LAB — CBC WITH DIFFERENTIAL/PLATELET
Abs Immature Granulocytes: 0.02 10*3/uL (ref 0.00–0.07)
Basophils Absolute: 0 10*3/uL (ref 0.0–0.1)
Basophils Relative: 0 %
Eosinophils Absolute: 0 10*3/uL (ref 0.0–0.5)
Eosinophils Relative: 0 %
HCT: 46.3 % (ref 39.0–52.0)
Hemoglobin: 15.6 g/dL (ref 13.0–17.0)
Immature Granulocytes: 0 %
Lymphocytes Relative: 14 %
Lymphs Abs: 1 10*3/uL (ref 0.7–4.0)
MCH: 31 pg (ref 26.0–34.0)
MCHC: 33.7 g/dL (ref 30.0–36.0)
MCV: 91.9 fL (ref 80.0–100.0)
Monocytes Absolute: 0.1 10*3/uL (ref 0.1–1.0)
Monocytes Relative: 1 %
Neutro Abs: 6.1 10*3/uL (ref 1.7–7.7)
Neutrophils Relative %: 85 %
Platelets: 201 10*3/uL (ref 150–400)
RBC: 5.04 MIL/uL (ref 4.22–5.81)
RDW: 11.9 % (ref 11.5–15.5)
WBC: 7.2 10*3/uL (ref 4.0–10.5)
nRBC: 0 % (ref 0.0–0.2)

## 2023-04-16 LAB — COMPREHENSIVE METABOLIC PANEL
ALT: 23 U/L (ref 0–44)
AST: 28 U/L (ref 15–41)
Albumin: 4.5 g/dL (ref 3.5–5.0)
Alkaline Phosphatase: 63 U/L (ref 38–126)
Anion gap: 13 (ref 5–15)
BUN: 16 mg/dL (ref 8–23)
CO2: 21 mmol/L — ABNORMAL LOW (ref 22–32)
Calcium: 9.4 mg/dL (ref 8.9–10.3)
Chloride: 102 mmol/L (ref 98–111)
Creatinine, Ser: 0.95 mg/dL (ref 0.61–1.24)
GFR, Estimated: >60 mL/min (ref >60)
Glucose, Bld: 118 mg/dL — ABNORMAL HIGH (ref 70–99)
Potassium: 3.6 mmol/L (ref 3.5–5.1)
Sodium: 136 mmol/L (ref 135–145)
Total Bilirubin: 1.6 mg/dL — ABNORMAL HIGH (ref 0.3–1.2)
Total Protein: 7.9 g/dL (ref 6.5–8.1)

## 2023-04-16 LAB — URINALYSIS, ROUTINE W REFLEX MICROSCOPIC
Bilirubin Urine: NEGATIVE
Glucose, UA: NEGATIVE mg/dL
Hgb urine dipstick: NEGATIVE
Ketones, ur: 5 mg/dL — AB
Leukocytes,Ua: NEGATIVE
Nitrite: NEGATIVE
Protein, ur: NEGATIVE mg/dL
Specific Gravity, Urine: 1.012 (ref 1.005–1.030)
pH: 6 (ref 5.0–8.0)

## 2023-04-16 MED ORDER — MORPHINE SULFATE (PF) 4 MG/ML IV SOLN
4.0000 mg | Freq: Once | INTRAVENOUS | Status: AC
  Administered 2023-04-16: 4 mg via INTRAVENOUS
  Filled 2023-04-16: qty 1

## 2023-04-16 MED ORDER — DICLOFENAC EPOLAMINE 1.3 % EX PTCH
1.0000 | MEDICATED_PATCH | Freq: Two times a day (BID) | CUTANEOUS | Status: DC
  Administered 2023-04-16: 1 via TRANSDERMAL
  Filled 2023-04-16: qty 1

## 2023-04-16 MED ORDER — ACETAMINOPHEN 500 MG PO TABS
1000.0000 mg | ORAL_TABLET | Freq: Once | ORAL | Status: AC
  Administered 2023-04-16: 1000 mg via ORAL
  Filled 2023-04-16: qty 2

## 2023-04-16 MED ORDER — DICLOFENAC SODIUM 1 % EX GEL: 4.0000 g | Freq: Four times a day (QID) | CUTANEOUS | 0 refills | Status: DC

## 2023-04-16 MED ORDER — KETOROLAC TROMETHAMINE 30 MG/ML IJ SOLN
7.5000 mg | Freq: Once | INTRAMUSCULAR | Status: AC
  Administered 2023-04-16: 7.5 mg via INTRAMUSCULAR
  Filled 2023-04-16: qty 1

## 2023-04-16 MED ORDER — HYDROCODONE-ACETAMINOPHEN 5-325 MG PO TABS: 1.0000 | ORAL_TABLET | Freq: Four times a day (QID) | ORAL | 0 refills | Status: DC | PRN

## 2023-04-16 NOTE — Telephone Encounter (Signed)
FYI patient went to the ED for this problem

## 2023-04-16 NOTE — ED Triage Notes (Signed)
Pt from home for right side flank/low back pain radiating down right leg starting a week ago maybe after mowing and progressively worsening. Seen at walk in and given prednisone and robaxin, took 40mg  prednisone and 750mg  robaxin 1hr pta. Reports some urinary frequency  160/92 HR 81 95% RA RR 18

## 2023-04-16 NOTE — Discharge Instructions (Addendum)
As we discussed, your workup in the ER today was reassuring for acute findings.  X-ray and laboratory evaluation did not reveal any emergent cause of your symptoms.  However, I suspect your symptoms are due to sciatica.   There is been a lot of research on back pain, unfortunately the only thing that seems to really help is Tylenol and ibuprofen.  Relative rest is also important to not lift greater than 10 pounds bending or twisting at the waist.  Please follow-up with your family physician.  The other thing that really seems to benefit patients is physical therapy which your doctor may send you for.  Please return to the emergency department for new numbness or weakness to your arms or legs. Difficulty with urinating or urinating or pooping on yourself.  Also if you cannot feel toilet paper when you wipe or get a fever.   Take 4 over the counter ibuprofen tablets 3 times a day or 2 over-the-counter naproxen tablets twice a day for pain. Also take tylenol 1000mg (2 extra strength) four times a day.   Additionally, I have written you for a few doses of Norco which is a narcotic pain medication for you to take as prescribed as needed for severe pain only.  Do not drive or operate machinery while taking this medication as it can be sedating.  I have also given you a prescription for Voltaren gel which you can place on the area that is hurting to help reduce inflammation. You can also try Salonpas specifically with menthol and methyl salicylate which you can get over-the-counter at your local pharmacy.  Return if development of any new or worsening symptoms.

## 2023-04-16 NOTE — ED Provider Notes (Signed)
[facility] EMERGENCY DEPARTMENT AT [facility] Provider Note   CSN: [id number] Arrival date & time: 04/16/23  4098     History  Chief Complaint  Patient presents with   Back Pain   Leg Pain    MARCELLAS MASCIA is a 72 y.o. male with history of hyperlipidemia, BPH, neuropathy who presents today with complaints of back pain. He states that same began initially last week when he was using his push mower and the self propel function was not operating correctly which required him to do more movement than normal. He did not have a specific injury during this episode. Notes that he woke up the next morning with pain which has progressively gotten worse. Pain is in the low back and radiates down his right leg. Denies any weakness or numbness/tingling in this leg. No history of similar symptoms previously. He originally went to [facility] walk-in clinic and was told this was likely a muscle strain and was given prednisone and robaxin which he has been taking. He notes that the prednisone does give him some relief but the robaxin does not seem to have any effect. He has also been taking tylenol and motrin with minimal improvement. He does note that he started Lipitor within the last month and wondered if his pain could be from this. He reached out to his primary doctor with this question and has not heard back. He had planned to go to the chiropractor this morning, however when he got up to urinate he noted a burning sensation and was concerned that this pain could be from his kidneys. He denies any fevers, chills, abdominal pain, nausea, vomiting, diarrhea, or hematuria. No urinary stream changes. No loss of bowel or bladder function or saddle paraesthesias.   The history is provided by the patient. No language interpreter was used.  Back Pain Associated symptoms: leg pain   Leg Pain Associated symptoms: back pain        Home Medications Prior to Admission medications   Medication Sig  Start Date End Date Taking? Authorizing Provider  ALFALFA PO Take by mouth daily. 2.4 grams    [provider]  APPLE CIDER VINEGAR PO Take by mouth daily.    [provider]  atorvastatin (LIPITOR) 20 MG tablet Take 1 tablet (20 mg total) by mouth daily. 03/18/23   Nelwyn Salisbury, MD  Cholecalciferol (VITAMIN D PO) Take by mouth.    [provider]  ELDERBERRY PO Take by mouth daily.    [provider]  fish oil-omega-3 fatty acids 1000 MG capsule Take 2 g by mouth daily.    [provider]  Garlic 1000 MG CAPS Take by mouth.    [provider]  MILK THISTLE PO Take by mouth daily.    [provider]  Probiotic Product (PROBIOTIC PO) Take by mouth daily.    [provider]  sodium chloride (OCEAN) 0.65 % SOLN nasal spray Place 1 spray into both nostrils as needed for congestion.    [provider]  UBIQUINOL PO Take by mouth daily.    [provider]      Allergies    Penicillins and Trazodone and nefazodone    Review of Systems   Review of Systems  Musculoskeletal:  Positive for back pain.  All other systems reviewed and are negative.   Physical Exam Updated Vital Signs BP (!) 151/88   Pulse 89   Temp 97.7 F (36.5 C) (Oral)  Resp 18   Ht 5\' 10"  (1.778 m)   Wt 73.9 kg   SpO2 97%   BMI 23.39 kg/m  Physical Exam Vitals and nursing note reviewed.  Constitutional:      General: He is not in acute distress.    Appearance: Normal appearance. He is normal weight. He is not ill-appearing, toxic-appearing or diaphoretic.  HENT:     Head: Normocephalic and atraumatic.  Cardiovascular:     Rate and Rhythm: Normal rate.  Pulmonary:     Effort: Pulmonary effort is normal. No respiratory distress.  Musculoskeletal:        General: Normal range of motion.     Cervical back: Normal range of motion.     Comments: No midline tenderness to palpation of the cervical, thoracic, or lumbar spine. No  step-offs, lesions, deformity, or overlying skin changes. Point tenderness noted to palpation of the upper right buttock. +SLR on the right. 5/5 strength and sensation intact in bilateral lower extremities.  DP PT pulses intact and 2+. Patient observed to be ambulatory with some discomfort.   Skin:    General: Skin is warm and dry.  Neurological:     General: No focal deficit present.     Mental Status: He is alert.  Psychiatric:        Mood and Affect: Mood normal.        Behavior: Behavior normal.     ED Results / Procedures / Treatments   Labs (all labs ordered are listed, but only abnormal results are displayed) Labs Reviewed  URINALYSIS, ROUTINE W REFLEX MICROSCOPIC - Abnormal; Notable for the following components:      Result Value   Ketones, ur 5 (*)    All other components within normal limits  COMPREHENSIVE METABOLIC PANEL - Abnormal; Notable for the following components:   CO2 21 (*)    Glucose, Bld 118 (*)    Total Bilirubin 1.6 (*)    All other components within normal limits  CBC WITH DIFFERENTIAL/PLATELET    EKG None  Radiology DG Lumbar Spine 2-3 Views  Result Date: 04/16/2023 CLINICAL DATA:  Back pain with right lower extremity radiculopathy EXAM: LUMBAR SPINE - 2-3 VIEW COMPARISON:  None Available. FINDINGS: No evidence of acute fracture. Vertebral body heights are maintained. Degenerative levoconvex and rotary scoliosis with the apex at L2-L3. Grade 1 anterolisthesis is present of L4 on L5 measuring approximately 6 mm. Relatively mild degenerative disc disease. However, there is significant facet arthropathy with hypertrophy and sclerosis at L3-L4, L4-L5 and L5-S1. No lytic or blastic osseous lesion. Unremarkable bowel gas pattern. IMPRESSION: 1. Degenerative levoconvex and rotary lumbar scoliosis with the apex at L2-L3. 2. Associated advanced facet arthropathy on the right at L3-L4 and bilaterally at L4-L5 and L5-S1. 3. No evidence of fracture or malalignment. 4.  Grade 1 anterolisthesis of L4 on L5. Electronically Signed   By: Malachy Moan M.D.   On: 04/16/2023 07:12    Procedures Procedures    Medications Ordered in ED Medications  ketorolac (TORADOL) 30 MG/ML injection 7.5 mg (has no administration in time range)  diclofenac (FLECTOR) 1.3 % 1 patch (has no administration in time range)  acetaminophen (TYLENOL) tablet 1,000 mg (1,000 mg Oral Given 04/16/23 0625)  morphine (PF) 4 MG/ML injection 4 mg (4 mg Intravenous Given 04/16/23 0710)    ED Course/ Medical Decision Making/ A&P  Medical Decision Making Amount and/or Complexity of Data Reviewed Labs: ordered.  Risk Prescription drug management.   This patient is a 71 y.o. male who presents to the ED for concern of back pain, this involves an extensive number of treatment options, and is a complaint that carries with it a high risk of complications and morbidity. The emergent differential diagnosis prior to evaluation includes, but is not limited to,  Fracture (acute/chronic), muscle strain, cauda equina, spinal stenosis, DDD, ligamentous injury, disk herniation, metastatic cancer, vertebral osteomyelitis, kidney stone, pyelonephritis, AAA, pancreatitis, bowel obstruction, meningitis.   This is not an exhaustive differential.   Past Medical History / Co-morbidities / Social History:  has a past medical history of adenomatous colonic polyps, Hyperlipidemia, and Retinal tear of right eye (2016). SDOH Screenings   Food Insecurity: No Food Insecurity (09/19/2022)  Housing: Low Risk  (09/19/2022)  Transportation Needs: No Transportation Needs (09/19/2022)  Utilities: Not At Risk (09/19/2022)  Alcohol Screen: Low Risk  (09/19/2022)  Depression (PHQ2-9): Low Risk  (03/14/2023)  Financial Resource Strain: Low Risk  (09/19/2022)  Physical Activity: Insufficiently Active (09/19/2022)  Social Connections: Moderately Isolated (09/19/2022)  Stress: No Stress Concern  Present (09/19/2022)  Tobacco Use: Low Risk  (03/14/2023)     Additional history: Chart reviewed.  Physical Exam: Physical exam performed. The pertinent findings include: per above, point tenderness noted to the right upper buttock. No midline tenderness.  Good distal pulses and sensation.   Lab Tests: I ordered, and personally interpreted labs.  The pertinent results include:  No acute laboratory abnormalities. UA noninfectious.   Imaging Studies: I ordered imaging studies including Dg lumbar spine. I independently visualized and interpreted imaging which showed   1. Degenerative levoconvex and rotary lumbar scoliosis with the apex at L2-L3. 2. Associated advanced facet arthropathy on the right at L3-L4 and bilaterally at L4-L5 and L5-S1. 3. No evidence of fracture or malalignment. 4. Grade 1 anterolisthesis of L4 on L5.  I agree with the radiologist interpretation.   Medications: I ordered medication including tylenol, morphine, toradol, diclofenac patch  for pain. Reevaluation of the patient after these medicines showed that the patient improved. I have reviewed the patients home medicines and have made adjustments as needed.   Disposition: After consideration of the diagnostic results and the patients response to treatment, I feel that emergency department workup does not suggest an emergent condition requiring admission or immediate intervention beyond what has been performed at this time. The plan is: discharge with norco and diclofenac patches for pain with recommendations for close outpatient follow-up, likely physical therapy. PDMP reviewed.  Patient advised not to drive or operate heavy machinery while taking these narcotic pain medications as well as associated increased fall risk.  Patients symptoms likely sciatica. His urine is noninfectious and his labs are benign. No neurodeficits to warrant further evaluation with MRI. Low suspicion for AAA. Discussed plan with patient who  is understanding and in agreement with this. Evaluation and diagnostic testing in the emergency department does not suggest an emergent condition requiring admission or immediate intervention beyond what has been performed at this time.  Plan for discharge with close PCP follow-up.  Patient is understanding and amenable with plan, educated on red flag symptoms that would prompt immediate return.  Patient discharged in stable condition.   This is a shared visit with supervising physician Dr. Jeraldine Loots who has independently evaluated patient & provided guidance in evaluation/management/disposition, in agreement with care   Final Clinical Impression(s) / ED Diagnoses Final  diagnoses:  Sciatica of right side    Rx / DC Orders ED Discharge Orders          Ordered    diclofenac Sodium (VOLTAREN) 1 % GEL  4 times daily        04/16/23 0759    HYDROcodone-acetaminophen (NORCO/VICODIN) 5-325 MG tablet  Every 6 hours PRN        04/16/23 0759          An After Visit Summary was printed and given to the patient.     Vear Clock 04/16/23 1610    Gerhard Munch, MD 04/16/23 1537

## 2023-04-17 NOTE — Telephone Encounter (Signed)
Spoke with pt advised to schedule appointment for ED f/u with Dr Clent Ridges to review this request. Scheduled for 04/21/23 at 3.45 pm

## 2023-04-19 ENCOUNTER — Encounter: Payer: Self-pay | Admitting: Family Medicine

## 2023-04-20 ENCOUNTER — Encounter: Payer: Self-pay | Admitting: Family Medicine

## 2023-04-21 ENCOUNTER — Encounter: Payer: Self-pay | Admitting: Family Medicine

## 2023-04-21 ENCOUNTER — Ambulatory Visit: Payer: Medicare HMO | Admitting: Family Medicine

## 2023-04-21 ENCOUNTER — Ambulatory Visit (INDEPENDENT_AMBULATORY_CARE_PROVIDER_SITE_OTHER): Payer: Medicare HMO | Admitting: Family Medicine

## 2023-04-21 VITALS — BP 120/80 | HR 75 | Temp 98.4°F | Wt 162.2 lb

## 2023-04-21 DIAGNOSIS — M5431 Sciatica, right side: Secondary | ICD-10-CM

## 2023-04-21 MED ORDER — CELECOXIB 200 MG PO CAPS: 200.0000 mg | ORAL_CAPSULE | Freq: Two times a day (BID) | ORAL | 2 refills | Status: DC | PRN

## 2023-04-21 MED ORDER — METHOCARBAMOL 750 MG PO TABS: 750.0000 mg | ORAL_TABLET | Freq: Four times a day (QID) | ORAL | 2 refills | Status: DC | PRN

## 2023-04-21 NOTE — Progress Notes (Signed)
   Subjective:    Patient ID: Christian Martin, male    DOB: 1951-04-06, 72 y.o.   MRN: [id number]  HPI Here to follow up on an urgent care visit on 04-15-23 and an ED visit on 04-16-23 for the sudden onset of pain in the right buttock that radiates down the back of the right thigh. No weakness or numbness. No recent trauma, but the pain started after mowing his lawn. At the urgent care he was given Prednisone and Methocarbamol. These did not help much so he went to the ED the next day. He had lumbar spine Xrays which showed some scoliosis and facet arthropathy. He was given a shot of Predisone which did not help, ans then he got a shot of Toradol which did help. He was sent home on Hydrocodone and Voltaren gel. These never helped his pain much. Since then the pain has settled down a little, but it stil hurts to stand or sit for more than a few minutes.    Review of Systems  Constitutional: Negative.   Respiratory: Negative.    Cardiovascular: Negative.   Musculoskeletal:  Positive for back pain.       Objective:   Physical Exam Constitutional:      General: He is not in acute distress.    Appearance: Normal appearance.  Cardiovascular:     Rate and Rhythm: Normal rate and regular rhythm.     Pulses: Normal pulses.     Heart sounds: Normal heart sounds.  Pulmonary:     Effort: Pulmonary effort is normal.     Breath sounds: Normal breath sounds.  Musculoskeletal:     Comments: The lower back is not tender, but he is very tender over the right sciatic notch. ROM is full, and SLR are negative.   Neurological:     Mental Status: He is alert.           Assessment & Plan:  Sciatica pain. He will try Celebrex 200 mg BID and add Methocarbamol as needed. Refer to PT. We spent a total of (32   ) minutes reviewing records and discussing these issues.  Gershon Crane, MD

## 2023-04-22 NOTE — Telephone Encounter (Signed)
Keep going with Celebrex 200 mg BID and Tylenol 1000 mg TID. Add soaking in a hot bathtub as needed

## 2023-04-22 NOTE — Telephone Encounter (Signed)
Pt was had OV with pt on 04/21/23 regarding this message

## 2023-04-23 ENCOUNTER — Encounter: Payer: Self-pay | Admitting: Family Medicine

## 2023-04-23 DIAGNOSIS — M5416 Radiculopathy, lumbar region: Secondary | ICD-10-CM

## 2023-04-24 NOTE — Telephone Encounter (Signed)
I ordered the MRI scan. I don't think the timing of the Celebrex doses matters much

## 2023-04-24 NOTE — Telephone Encounter (Signed)
Yes I would try heat at this point

## 2023-04-26 ENCOUNTER — Encounter: Payer: Self-pay | Admitting: Family Medicine

## 2023-04-28 ENCOUNTER — Encounter: Payer: Self-pay | Admitting: Family Medicine

## 2023-04-29 ENCOUNTER — Encounter: Payer: Self-pay | Admitting: Family Medicine

## 2023-04-29 ENCOUNTER — Telehealth (INDEPENDENT_AMBULATORY_CARE_PROVIDER_SITE_OTHER): Payer: Medicare HMO | Admitting: Family Medicine

## 2023-04-29 ENCOUNTER — Telehealth: Payer: Self-pay | Admitting: *Deleted

## 2023-04-29 DIAGNOSIS — M5431 Sciatica, right side: Secondary | ICD-10-CM

## 2023-04-29 MED ORDER — TRAMADOL HCL 50 MG PO TABS
100.0000 mg | ORAL_TABLET | Freq: Every evening | ORAL | 0 refills | Status: DC | PRN
Start: 2023-04-29 — End: 2023-06-18

## 2023-04-29 MED ORDER — GABAPENTIN 100 MG PO CAPS: 200.0000 mg | ORAL_CAPSULE | Freq: Every day | ORAL | 0 refills | Status: DC

## 2023-04-29 NOTE — Progress Notes (Signed)
Subjective:    Patient ID: Christian Martin, male    DOB: [birth year], 72 y.o.   MRN: [id number]  HPI Virtual Visit via Video Note  I connected with the patient on 04/29/23 at  1:45 PM EDT by a video enabled telemedicine application and verified that I am speaking with the correct person using two identifiers.  Location patient: home Location provider:work or home office Persons participating in the virtual visit: patient, provider  I discussed the limitations of evaluation and management by telemedicine and the availability of in person appointments. The patient expressed understanding and agreed to proceed.   HPI: Here to follow up on right sided sciatica. He has been taking Celbrex BID, and this has made the pain much more tolerable during the day. However he cannot sleep at night because the pain gets worse. He has some Gabapentin he borrowed from a friend, and taking 200 mg of this at bedtime has helped a little. He has his first PT session tomorrow morning.    ROS: See pertinent positives and negatives per HPI.  Past Medical History:  Diagnosis Date   Hx of adenomatous colonic polyps    Hyperlipidemia    Retinal tear of right eye 2016   saw Dr. Randon Goldsmith     Past Surgical History:  Procedure Laterality Date   COLONOSCOPY  11/09/2019   per Dr. Adela Lank, adenomatous polyp, repeat in 7 yrs    LACERATION REPAIR     Please update my chart: 01-18-18 Lacerated forehead wound 1.5" treated with 6 sutures at [facility] on [address].    NASAL SEPTOPLASTY W/ TURBINOPLASTY Bilateral 11/09/2021   per Dr. Elane Fritz in [place]   WISDOM TOOTH EXTRACTION      Family History  Problem Relation Age of Onset   Cancer Sister        breast   Osteoporosis Mother    Hypertension Mother    Heart failure Father    Breast cancer Other    Coronary artery disease Other    Colon cancer Neg Hx    Esophageal cancer Neg Hx    Rectal cancer Neg Hx    Stomach cancer Neg Hx      Current  Outpatient Medications:    ALFALFA PO, Take by mouth daily. 2.4 grams, Disp: , Rfl:    APPLE CIDER VINEGAR PO, Take by mouth daily., Disp: , Rfl:    atorvastatin (LIPITOR) 20 MG tablet, Take 1 tablet (20 mg total) by mouth daily., Disp: 90 tablet, Rfl: 3   celecoxib (CELEBREX) 200 MG capsule, Take 1 capsule (200 mg total) by mouth 2 (two) times daily as needed for moderate pain., Disp: 60 capsule, Rfl: 2   Cholecalciferol (VITAMIN D PO), Take by mouth., Disp: , Rfl:    ELDERBERRY PO, Take by mouth daily., Disp: , Rfl:    fish oil-omega-3 fatty acids 1000 MG capsule, Take 2 g by mouth daily., Disp: , Rfl:    Garlic 1000 MG CAPS, Take by mouth., Disp: , Rfl:    methocarbamol (ROBAXIN) 750 MG tablet, Take 1 tablet (750 mg total) by mouth every 6 (six) hours as needed for muscle spasms., Disp: 60 tablet, Rfl: 2   MILK THISTLE PO, Take by mouth daily., Disp: , Rfl:    Probiotic Product (PROBIOTIC PO), Take by mouth daily., Disp: , Rfl:    sodium chloride (OCEAN) 0.65 % SOLN nasal spray, Place 1 spray into both nostrils as needed for congestion., Disp: , Rfl:  UBIQUINOL PO, Take by mouth daily., Disp: , Rfl:   EXAM:  VITALS per patient if applicable:  GENERAL: alert, oriented, appears well and in no acute distress  HEENT: atraumatic, conjunttiva clear, no obvious abnormalities on inspection of external nose and ears  NECK: normal movements of the head and neck  LUNGS: on inspection no signs of respiratory distress, breathing rate appears normal, no obvious gross SOB, gasping or wheezing  CV: no obvious cyanosis  MS: moves all visible extremities without noticeable abnormality  PSYCH/NEURO: pleasant and cooperative, no obvious depression or anxiety, speech and thought processing grossly intact  ASSESSMENT AND PLAN: Right sided sciatica. We will send in more Gabapentin for him to use at bedtime. We will also provide some Tramadol to take at bedtime.  Gershon Crane, MD  Discussed the  following assessment and plan:  No diagnosis found.     I discussed the assessment and treatment plan with the patient. The patient was provided an opportunity to ask questions and all were answered. The patient agreed with the plan and demonstrated an understanding of the instructions.   The patient was advised to call back or seek an in-person evaluation if the symptoms worsen or if the condition fails to improve as anticipated.      Review of Systems     Objective:   Physical Exam        Assessment & Plan:

## 2023-04-29 NOTE — Telephone Encounter (Signed)
Transition Care Management Unsuccessful Follow-up Telephone Call  Date of discharge and from where:  [facility] 04/16/2023  Attempts:  1st Attempt  Reason for unsuccessful TCM follow-up call:  No answer/busy

## 2023-04-30 ENCOUNTER — Telehealth: Payer: Self-pay | Admitting: *Deleted

## 2023-04-30 DIAGNOSIS — M5431 Sciatica, right side: Secondary | ICD-10-CM | POA: Diagnosis not present

## 2023-04-30 NOTE — Telephone Encounter (Signed)
Transition Care Management Unsuccessful Follow-up Telephone Call  Date of discharge and from where:  [facility]  04/16/2023  Attempts:  2nd Attempt  Reason for unsuccessful TCM follow-up call:  No answer/busy

## 2023-05-01 MED ORDER — OXYCODONE HCL 10 MG PO TABS: ORAL_TABLET | ORAL | 0 refills | Status: DC

## 2023-05-01 NOTE — Telephone Encounter (Signed)
See my other reply today

## 2023-05-01 NOTE — Telephone Encounter (Signed)
I have so far tried to avoid using narcotic meds because Christian Martin asked me to. Now I have sent in a RX for Oxycodone (this is stronger than Hydrocodone) that he can use for pain. I would definitely get the MRI

## 2023-05-02 ENCOUNTER — Encounter: Payer: Self-pay | Admitting: Family Medicine

## 2023-05-05 NOTE — Telephone Encounter (Signed)
Dr Clent Ridges recommendation was sent to pt via MyChart

## 2023-05-06 DIAGNOSIS — M5431 Sciatica, right side: Secondary | ICD-10-CM | POA: Diagnosis not present

## 2023-05-06 NOTE — Telephone Encounter (Signed)
Let's see how he does over the next 10 days, and then we can make that decision

## 2023-05-08 DIAGNOSIS — M5431 Sciatica, right side: Secondary | ICD-10-CM | POA: Diagnosis not present

## 2023-05-13 ENCOUNTER — Telehealth: Payer: Self-pay | Admitting: Family Medicine

## 2023-05-13 DIAGNOSIS — M5431 Sciatica, right side: Secondary | ICD-10-CM | POA: Diagnosis not present

## 2023-05-13 NOTE — Telephone Encounter (Signed)
Pt states his MRI in referral [id number] was denied by his insurance. Asking if he actually needs the MRI, requesting a call

## 2023-05-14 ENCOUNTER — Encounter: Payer: Self-pay | Admitting: Family Medicine

## 2023-05-14 NOTE — Telephone Encounter (Signed)
I am glad he is feeling better. I cannot answer the question of why the scan was refused other than insurance companies care more about making money than helping people. This problem of denying care is getting worse all the time

## 2023-05-15 NOTE — Telephone Encounter (Signed)
Noted  

## 2023-05-15 NOTE — Telephone Encounter (Signed)
This was addressed yesterday. Since he is getting better, I do not think the MRI is needed

## 2023-05-16 ENCOUNTER — Other Ambulatory Visit: Payer: Self-pay

## 2023-05-16 MED ORDER — ATORVASTATIN CALCIUM 20 MG PO TABS: 20.0000 mg | ORAL_TABLET | Freq: Every day | ORAL | 3 refills | Status: DC

## 2023-05-16 MED ORDER — METHOCARBAMOL 750 MG PO TABS: 750.0000 mg | ORAL_TABLET | Freq: Four times a day (QID) | ORAL | 2 refills | Status: DC | PRN

## 2023-05-16 MED ORDER — CELECOXIB 200 MG PO CAPS: 200.0000 mg | ORAL_CAPSULE | Freq: Two times a day (BID) | ORAL | 2 refills | Status: DC | PRN

## 2023-05-16 MED ORDER — GABAPENTIN 100 MG PO CAPS: 200.0000 mg | ORAL_CAPSULE | Freq: Every day | ORAL | 0 refills | Status: DC

## 2023-05-17 ENCOUNTER — Inpatient Hospital Stay: Admission: RE | Admit: 2023-05-17 | Payer: Medicare HMO | Source: Ambulatory Visit

## 2023-05-19 NOTE — Telephone Encounter (Signed)
Noted  

## 2023-05-22 NOTE — Telephone Encounter (Signed)
This is simply because the scan is expensive. We will just have to wait

## 2023-06-16 ENCOUNTER — Encounter: Payer: Self-pay | Admitting: Family Medicine

## 2023-06-16 DIAGNOSIS — E785 Hyperlipidemia, unspecified: Secondary | ICD-10-CM

## 2023-06-18 ENCOUNTER — Other Ambulatory Visit: Payer: Medicare HMO

## 2023-06-18 ENCOUNTER — Other Ambulatory Visit: Payer: Self-pay | Admitting: Family Medicine

## 2023-06-18 DIAGNOSIS — E785 Hyperlipidemia, unspecified: Secondary | ICD-10-CM

## 2023-06-18 LAB — LIPID PANEL
Cholesterol: 160 mg/dL (ref 0–200)
HDL: 56.9 mg/dL (ref >39.00)
LDL Cholesterol: 79 mg/dL (ref 0–99)
NonHDL: 102.9
Total CHOL/HDL Ratio: 3
Triglycerides: 121 mg/dL (ref 0.0–149.0)
VLDL: 24.2 mg/dL (ref 0.0–40.0)

## 2023-06-20 NOTE — Telephone Encounter (Signed)
I agree. Let's plan on repeating labs in 90 days. I have already placed future lab orders for lipids and a liver panel

## 2023-07-04 ENCOUNTER — Other Ambulatory Visit: Payer: Self-pay | Admitting: Medical Genetics

## 2023-07-04 DIAGNOSIS — Z006 Encounter for examination for normal comparison and control in clinical research program: Secondary | ICD-10-CM

## 2023-07-30 ENCOUNTER — Other Ambulatory Visit (HOSPITAL_COMMUNITY)
Admission: RE | Admit: 2023-07-30 | Discharge: 2023-07-30 | Disposition: A | Discharge status: Home / Self Care | Payer: Medicare HMO | Source: Ambulatory Visit | Attending: Medical Genetics | Admitting: Medical Genetics

## 2023-07-30 ENCOUNTER — Encounter: Payer: Self-pay | Admitting: Family Medicine

## 2023-07-30 DIAGNOSIS — Z006 Encounter for examination for normal comparison and control in clinical research program: Secondary | ICD-10-CM | POA: Insufficient documentation

## 2023-08-12 LAB — GENECONNECT MOLECULAR SCREEN: Genetic Analysis Overall Interpretation: NEGATIVE

## 2023-08-25 ENCOUNTER — Encounter: Payer: Self-pay | Admitting: Family Medicine

## 2023-09-03 NOTE — Telephone Encounter (Signed)
 Noted.

## 2023-09-15 ENCOUNTER — Encounter: Payer: Self-pay | Admitting: Family Medicine

## 2023-09-15 DIAGNOSIS — R911 Solitary pulmonary nodule: Secondary | ICD-10-CM

## 2023-09-15 NOTE — Telephone Encounter (Signed)
He does not need another cardiac calcium score, but I did order another chest CT to check on that nodule

## 2023-09-18 ENCOUNTER — Other Ambulatory Visit (INDEPENDENT_AMBULATORY_CARE_PROVIDER_SITE_OTHER): Payer: Medicare HMO

## 2023-09-18 DIAGNOSIS — E785 Hyperlipidemia, unspecified: Secondary | ICD-10-CM

## 2023-09-18 LAB — HEPATIC FUNCTION PANEL
ALT: 38 U/L (ref 0–53)
AST: 33 U/L (ref 0–37)
Albumin: 4.5 g/dL (ref 3.5–5.2)
Alkaline Phosphatase: 68 U/L (ref 39–117)
Bilirubin, Direct: 0.2 mg/dL (ref 0.0–0.3)
Total Bilirubin: 1 mg/dL (ref 0.2–1.2)
Total Protein: 7 g/dL (ref 6.0–8.3)

## 2023-09-18 LAB — LIPID PANEL
Cholesterol: 140 mg/dL (ref 0–200)
HDL: 59.1 mg/dL (ref >39.00)
LDL Cholesterol: 68 mg/dL (ref 0–99)
NonHDL: 80.86
Total CHOL/HDL Ratio: 2
Triglycerides: 63 mg/dL (ref 0.0–149.0)
VLDL: 12.6 mg/dL (ref 0.0–40.0)

## 2023-09-22 ENCOUNTER — Encounter: Payer: Self-pay | Admitting: *Deleted

## 2023-09-26 ENCOUNTER — Ambulatory Visit (INDEPENDENT_AMBULATORY_CARE_PROVIDER_SITE_OTHER): Payer: Medicare HMO

## 2023-09-26 VITALS — Ht 70.5 in | Wt 162.0 lb

## 2023-09-26 DIAGNOSIS — Z0000 Encounter for general adult medical examination without abnormal findings: Secondary | ICD-10-CM | POA: Diagnosis not present

## 2023-09-26 NOTE — Progress Notes (Signed)
Subjective:   Christian Martin is a 73 y.o. male who presents for Medicare Annual/Subsequent preventive examination.  Visit Complete: Virtual I connected with  [name] on 09/26/23 by a audio enabled telemedicine application and verified that I am speaking with the correct person using two identifiers.  Patient Location: Home  Provider Location: Office/Clinic  I discussed the limitations of evaluation and management by telemedicine. The patient expressed understanding and agreed to proceed.  Vital Signs: Because this visit was a virtual/telehealth visit, some criteria may be missing or patient reported. Any vitals not documented were not able to be obtained and vitals that have been documented are patient reported.  Patient Medicare AWV questionnaire was completed by the patient on 09/22/23; I have confirmed that all information answered by patient is correct and no changes since this date.  Cardiac Risk Factors include: advanced age (>30men, >37 women);male gender;dyslipidemia     Objective:    Today's Vitals   09/26/23 1541  Weight: 162 lb (73.5 kg)  Height: 5' 10.5" (1.791 m)   Body mass index is 22.92 kg/m.     09/26/2023    3:51 PM 09/19/2022   12:44 PM 09/10/2021    9:30 AM 09/12/2020    2:21 PM 06/21/2016   10:19 AM  Advanced Directives  Does Patient Have a Medical Advance Directive? Yes Yes Yes Yes No  Type of Estate agent of [place];Living will Healthcare Power of [place];Living will Healthcare Power of [place];Living will Healthcare Power of [place];Living will   Does patient want to make changes to medical advance directive? No - Patient declined No - Patient declined No - Patient declined No - Patient declined   Copy of Healthcare Power of Attorney in Chart? Yes - validated most recent copy scanned in chart (See row information) Yes - validated most recent copy scanned in chart (See row information) No - copy requested Yes - validated  most recent copy scanned in chart (See row information)   Would patient like information on creating a medical advance directive?     No - patient declined information    Current Medications (verified) Outpatient Encounter Medications as of 09/26/2023  Medication Sig   ALFALFA PO Take by mouth daily. 2.4 grams   APPLE CIDER VINEGAR PO Take by mouth daily.   atorvastatin (LIPITOR) 20 MG tablet Take 1 tablet (20 mg total) by mouth daily.   Cholecalciferol (VITAMIN D PO) Take by mouth.   ELDERBERRY PO Take by mouth daily.   fish oil-omega-3 fatty acids 1000 MG capsule Take 2 g by mouth daily.   Garlic 1000 MG CAPS Take by mouth.   MILK THISTLE PO Take by mouth daily.   Probiotic Product (PROBIOTIC PO) Take by mouth daily.   sodium chloride (OCEAN) 0.65 % SOLN nasal spray Place 1 spray into both nostrils as needed for congestion.   UBIQUINOL PO Take by mouth daily.   triamcinolone (NASACORT ALLERGY 24HR) 55 MCG/ACT AERO nasal inhaler Place 2 sprays into the nose daily. As needed   No facility-administered encounter medications on file as of 09/26/2023.    Allergies (verified) Penicillins and Trazodone and nefazodone   History: Past Medical History:  Diagnosis Date   Hx of adenomatous colonic polyps    Hyperlipidemia    Retinal tear of right eye 2016   saw Dr. Randon Goldsmith    Past Surgical History:  Procedure Laterality Date   COLONOSCOPY  11/09/2019   per Dr. Adela Lank, adenomatous polyp, repeat in  7 yrs    LACERATION REPAIR     Please update my chart: 01-18-18 Lacerated forehead wound 1.5" treated with 6 sutures at [facility] on [address].    NASAL SEPTOPLASTY W/ TURBINOPLASTY Bilateral 11/09/2021   per Dr. Elane Fritz in [place]   WISDOM TOOTH EXTRACTION     Family History  Problem Relation Age of Onset   Cancer Sister        breast   Osteoporosis Mother    Hypertension Mother    Heart failure Father    Breast cancer Other    Coronary artery disease Other    Colon cancer  Neg Hx    Esophageal cancer Neg Hx    Rectal cancer Neg Hx    Stomach cancer Neg Hx    Social History   Socioeconomic History   Marital status: Single    Spouse name: Not on file   Number of children: Not on file   Years of education: Not on file   Highest education level: Master's degree (e.g., MA, MS, MEng, MEd, MSW, MBA)  Occupational History   Not on file  Tobacco Use   Smoking status: Never   Smokeless tobacco: Never  Substance and Sexual Activity   Alcohol use: Not Currently    Alcohol/week: 0.0 standard drinks of alcohol    Comment: glass of wine each day   Drug use: No   Sexual activity: Not on file  Other Topics Concern   Not on file  Social History Narrative   Not on file   Social Drivers of Health   Financial Resource Strain: Low Risk  (09/26/2023)   Overall Financial Resource Strain (CARDIA)    Difficulty of Paying Living Expenses: Not hard at all  Food Insecurity: No Food Insecurity (09/26/2023)   Hunger Vital Sign    Worried About Running Out of Food in the Last Year: Never true    Ran Out of Food in the Last Year: Never true  Transportation Needs: No Transportation Needs (09/26/2023)   PRAPARE - Administrator, Civil Service (Medical): No    Lack of Transportation (Non-Medical): No  Physical Activity: Insufficiently Active (09/26/2023)   Exercise Vital Sign    Days of Exercise per Week: 3 days    Minutes of Exercise per Session: 30 min  Stress: No Stress Concern Present (09/26/2023)   [employer] of Occupational Health - Occupational Stress Questionnaire    Feeling of Stress : Not at all  Social Connections: Socially Isolated (09/26/2023)   Social Connection and Isolation Panel [NHANES]    Frequency of Communication with Friends and Family: Once a week    Frequency of Social Gatherings with Friends and Family: Once a week    Attends Religious Services: Never    Database administrator or Organizations: No    Attends Hospital doctor: Never    Marital Status: Never married    Tobacco Counseling Counseling given: Not Answered   Clinical Intake:  Pre-visit preparation completed: Yes  Pain : No/denies pain     BMI - recorded: 22.92 Nutritional Status: BMI of 19-24  Normal Diabetes: No  How often do you need to have someone help you when you read instructions, pamphlets, or other written materials from your doctor or pharmacy?: 1 - Never  Interpreter Needed?: No  Information entered by :: Hassell Halim, CMA   Activities of Daily Living    09/26/2023    3:44 PM 09/22/2023    8:59  AM  In your present state of health, do you have any difficulty performing the following activities:  Hearing? 0 0  Vision? 0 0  Difficulty concentrating or making decisions? 0 0  Walking or climbing stairs? 0 0  Dressing or bathing? 0 0  Doing errands, shopping? 0 0  Preparing Food and eating ? N N  Using the Toilet? N N  In the past six months, have you accidently leaked urine? N N  Do you have problems with loss of bowel control? N N  Managing your Medications? N N  Managing your Finances? N N  Housekeeping or managing your Housekeeping? N N    Patient Care Team: Nelwyn Salisbury, MD as PCP - General Reinaldo Raddle, Encarnacion Slates, [facility] (Inactive) as Pharmacist (Pharmacist)  Indicate any recent Medical Services you may have received from other than [facility] providers in the past year (date may be approximate).     Assessment:   This is a routine wellness examination for Lakyn.  Hearing/Vision screen Hearing Screening - Comments:: Denies hearing difficulties   Vision Screening - Comments:: Wears rx glasses - up to date with routine eye exams with  [facility]   Goals Addressed               This Visit's Progress     Increase physical activity (pt-stated)        Continue exercising and stay active.       Depression Screen    09/26/2023    3:49 PM 09/26/2023    3:48 PM 04/21/2023    4:27 PM 03/14/2023    8:56  AM 09/19/2022   12:42 PM 03/12/2022    8:32 AM 09/10/2021    9:24 AM  PHQ 2/9 Scores  PHQ - 2 Score 0 0 3 0 0 1 0  PHQ- 9 Score   12 3  3      Fall Risk    09/26/2023    3:50 PM 09/22/2023    8:59 AM 04/21/2023    4:26 PM 04/18/2023   10:14 AM 03/14/2023    8:56 AM  Fall Risk   Falls in the past year? 0 1 0 0 0  Number falls in past yr: 0 0 0  0  Injury with Fall? 0 0 0  0  Risk for fall due to : No Fall Risks  History of fall(s)  No Fall Risks  Follow up Falls prevention discussed  Falls evaluation completed  Falls evaluation completed    MEDICARE RISK AT HOME: Medicare Risk at Home Any stairs in or around the home?: Yes If so, are there any without handrails?: No Home free of loose throw rugs in walkways, pet beds, electrical cords, etc?: Yes Adequate lighting in your home to reduce risk of falls?: Yes Life alert?: No Use of a cane, walker or w/c?: No Grab bars in the bathroom?: No Shower chair or bench in shower?: No Elevated toilet seat or a handicapped toilet?: Yes  TIMED UP AND GO:  Was the test performed?  No    Cognitive Function:    06/21/2016   10:36 AM  MMSE - Mini Mental State Exam  Not completed: --        09/26/2023    3:51 PM 09/19/2022   12:44 PM 09/10/2021    9:29 AM  6CIT Screen  What Year? 0 points 0 points 0 points  What month? 0 points 0 points 0 points  What time? 0 points 0 points  0 points  Count back from 20 0 points 0 points 0 points  Months in reverse 0 points 0 points 0 points  Repeat phrase 0 points 0 points 0 points  Total Score 0 points 0 points 0 points    Immunizations Immunization History  Administered Date(s) Administered   Fluad Quad(high Dose 65+) 05/24/2021, 05/18/2022   Influenza, High Dose Seasonal PF 05/31/2017, 04/22/2019, 06/11/2020, 04/27/2023   Influenza,inj,Quad PF,6+ Mos 04/21/2015, 05/06/2016   Influenza-Unspecified 05/31/2017, 05/20/2023   PFIZER(Purple Top)SARS-COV-2 Vaccination 09/30/2019, 10/25/2019,  05/28/2020, 12/26/2020   Pfizer Covid-19 Vaccine Bivalent Booster 18yrs & up 05/07/2021, 05/18/2022, 05/14/2023   Pneumococcal Conjugate-13 01/10/2014   Pneumococcal Polysaccharide-23 04/21/2015   Rsv, Bivalent, Protein Subunit Rsvpref,pf Verdis Frederickson) 08/05/2022   Tdap 05/06/2016   Unspecified SARS-COV-2 Vaccination 04/27/2023   Zoster Recombinant(Shingrix) 03/08/2019, 05/10/2019   Zoster, Live 01/08/2013    TDAP status: Up to date 05/06/16  Flu Vaccine status: Up to date 04/27/23  Pneumococcal vaccine status: Up to date 05/22/15  Covid-19 vaccine status: Completed vaccines  Qualifies for Shingles Vaccine? Yes   Zostavax completed Yes   Shingrix Completed?: Yes 05/10/19  Screening Tests Health Maintenance  Topic Date Due   Medicare Annual Wellness (AWV)  09/25/2024   DTaP/Tdap/Td (2 - Td or Tdap) 05/06/2026   Colonoscopy  11/08/2029   Pneumonia Vaccine 49+ Years old  Completed   INFLUENZA VACCINE  Completed   COVID-19 Vaccine  Completed   Hepatitis C Screening  Completed   Zoster Vaccines- Shingrix  Completed   HPV VACCINES  Aged Out    Health Maintenance  There are no preventive care reminders to display for this patient.   Colorectal cancer screening: Type of screening: Colonoscopy. Completed 11/09/19. Repeat every 10 years  Additional Screening:  Hepatitis C Screening:Does qualify. Completed 01/03/17  Vision Screening: Recommended annual ophthalmology exams for early detection of glaucoma and other disorders of the eye. Is the patient up to date with their annual eye exam?  Yes  Who is the provider or what is the name of the office in which the patient attends annual eye exams? [facility] If pt is not established with a provider, would they like to be referred to a provider to establish care? No .   Dental Screening: Recommended annual dental exams for proper oral hygiene  Community Resource Referral / Chronic Care Management: CRR required this visit?  No   CCM  required this visit?  No     Plan:     I have personally reviewed and noted the following in the patient's chart:   Medical and social history Use of alcohol, tobacco or illicit drugs  Current medications and supplements including opioid prescriptions. Patient is not currently taking opioid prescriptions. Functional ability and status Nutritional status Physical activity Advanced directives List of other physicians Hospitalizations, surgeries, and ER visits in previous 12 months Vitals Screenings to include cognitive, depression, and falls Referrals and appointments  In addition, I have reviewed and discussed with patient certain preventive protocols, quality metrics, and best practice recommendations. A written personalized care plan for preventive services as well as general preventive health recommendations were provided to patient.     Tillie Rung, LPN   1/61/0960   After Visit Summary: (MyChart) Due to this being a telephonic visit, the after visit summary with patients personalized plan was offered to patient via MyChart   Nurse Notes: none

## 2023-09-26 NOTE — Patient Instructions (Addendum)
Christian Martin , Thank you for taking time to come for your Medicare Wellness Visit. I appreciate your ongoing commitment to your health goals. Please review the following plan we discussed and let me know if I can assist you in the future.   Referrals/Orders/Follow-Ups/Clinician Recommendations:   This is a list of the screening recommended for you and due dates:  Health Maintenance  Topic Date Due   Medicare Annual Wellness Visit  09/25/2024   DTaP/Tdap/Td vaccine (2 - Td or Tdap) 05/06/2026   Colon Cancer Screening  11/08/2029   Pneumonia Vaccine  Completed   Flu Shot  Completed   COVID-19 Vaccine  Completed   Hepatitis C Screening  Completed   Zoster (Shingles) Vaccine  Completed   HPV Vaccine  Aged Out    Advanced directives: (In Chart) A copy of your advanced directives are scanned into your chart should your provider ever need it.  Next Medicare Annual Wellness Visit scheduled for next year: Yes

## 2023-10-10 ENCOUNTER — Ambulatory Visit
Admission: RE | Admit: 2023-10-10 | Discharge: 2023-10-10 | Disposition: A | Discharge status: Home / Self Care | Payer: Medicare HMO | Source: Ambulatory Visit | Attending: Family Medicine | Admitting: Family Medicine

## 2023-10-10 DIAGNOSIS — R911 Solitary pulmonary nodule: Secondary | ICD-10-CM | POA: Diagnosis not present

## 2023-10-16 ENCOUNTER — Encounter: Payer: Self-pay | Admitting: Family Medicine

## 2023-10-20 NOTE — Telephone Encounter (Signed)
 The report should be out this week

## 2023-10-23 NOTE — Telephone Encounter (Signed)
 Whenever we get a scan like this, the radiologist who reads it always goes back and compares this to any previous scans. Since the 3 nodules were recently read as "stable", that means they were present on the previous scan as well. I think what happened was the previous scan on 04-03-21 was designed primarily to get a calcium score on his heart arteries, whereas the last scan was dedicated to the lungs. The most recent radiologist must have seen the other nodules that the previous radiologist did not see.

## 2023-11-06 DIAGNOSIS — Z7952 Long term (current) use of systemic steroids: Secondary | ICD-10-CM | POA: Diagnosis not present

## 2023-11-06 DIAGNOSIS — Z809 Family history of malignant neoplasm, unspecified: Secondary | ICD-10-CM | POA: Diagnosis not present

## 2023-11-06 DIAGNOSIS — F320 Major depressive disorder, single episode, mild: Secondary | ICD-10-CM | POA: Diagnosis not present

## 2023-11-06 DIAGNOSIS — J309 Allergic rhinitis, unspecified: Secondary | ICD-10-CM | POA: Diagnosis not present

## 2023-11-06 DIAGNOSIS — Z8249 Family history of ischemic heart disease and other diseases of the circulatory system: Secondary | ICD-10-CM | POA: Diagnosis not present

## 2023-11-06 DIAGNOSIS — K219 Gastro-esophageal reflux disease without esophagitis: Secondary | ICD-10-CM | POA: Diagnosis not present

## 2023-11-06 DIAGNOSIS — M5440 Lumbago with sciatica, unspecified side: Secondary | ICD-10-CM | POA: Diagnosis not present

## 2023-11-06 DIAGNOSIS — R030 Elevated blood-pressure reading, without diagnosis of hypertension: Secondary | ICD-10-CM | POA: Diagnosis not present

## 2023-11-06 DIAGNOSIS — Z5941 Food insecurity: Secondary | ICD-10-CM | POA: Diagnosis not present

## 2023-11-06 DIAGNOSIS — N529 Male erectile dysfunction, unspecified: Secondary | ICD-10-CM | POA: Diagnosis not present

## 2023-11-06 DIAGNOSIS — G629 Polyneuropathy, unspecified: Secondary | ICD-10-CM | POA: Diagnosis not present

## 2023-11-06 DIAGNOSIS — E785 Hyperlipidemia, unspecified: Secondary | ICD-10-CM | POA: Diagnosis not present

## 2023-11-07 ENCOUNTER — Encounter: Payer: Self-pay | Admitting: Family Medicine

## 2023-11-07 NOTE — Telephone Encounter (Signed)
 There is no way to document a home visit from an Biomedical engineer. As for the Medicare Wellness visit, he can simply decline to do them from now on

## 2023-11-18 ENCOUNTER — Encounter: Payer: Self-pay | Admitting: Family Medicine

## 2023-11-29 ENCOUNTER — Encounter: Payer: Self-pay | Admitting: Family Medicine

## 2023-12-02 NOTE — Telephone Encounter (Signed)
 We can wait until July to check his lipids again. Tel him to remind me at that time to order the pancreas tests as well.

## 2024-02-06 DIAGNOSIS — H2513 Age-related nuclear cataract, bilateral: Secondary | ICD-10-CM | POA: Diagnosis not present

## 2024-02-06 DIAGNOSIS — H524 Presbyopia: Secondary | ICD-10-CM | POA: Diagnosis not present

## 2024-02-06 DIAGNOSIS — H43393 Other vitreous opacities, bilateral: Secondary | ICD-10-CM | POA: Diagnosis not present

## 2024-03-14 ENCOUNTER — Encounter: Payer: Self-pay | Admitting: Family Medicine

## 2024-03-15 ENCOUNTER — Ambulatory Visit

## 2024-03-15 ENCOUNTER — Ambulatory Visit (INDEPENDENT_AMBULATORY_CARE_PROVIDER_SITE_OTHER): Admitting: Family Medicine

## 2024-03-15 ENCOUNTER — Encounter: Payer: Self-pay | Admitting: Family Medicine

## 2024-03-15 VITALS — BP 120/80 | HR 93 | Temp 98.3°F | Ht 68.5 in | Wt 165.8 lb

## 2024-03-15 DIAGNOSIS — M8580 Other specified disorders of bone density and structure, unspecified site: Secondary | ICD-10-CM | POA: Diagnosis not present

## 2024-03-15 DIAGNOSIS — R739 Hyperglycemia, unspecified: Secondary | ICD-10-CM | POA: Diagnosis not present

## 2024-03-15 DIAGNOSIS — M546 Pain in thoracic spine: Secondary | ICD-10-CM | POA: Diagnosis not present

## 2024-03-15 DIAGNOSIS — N138 Other obstructive and reflux uropathy: Secondary | ICD-10-CM

## 2024-03-15 DIAGNOSIS — F419 Anxiety disorder, unspecified: Secondary | ICD-10-CM | POA: Diagnosis not present

## 2024-03-15 DIAGNOSIS — K219 Gastro-esophageal reflux disease without esophagitis: Secondary | ICD-10-CM | POA: Diagnosis not present

## 2024-03-15 DIAGNOSIS — G629 Polyneuropathy, unspecified: Secondary | ICD-10-CM | POA: Diagnosis not present

## 2024-03-15 DIAGNOSIS — E538 Deficiency of other specified B group vitamins: Secondary | ICD-10-CM

## 2024-03-15 DIAGNOSIS — E785 Hyperlipidemia, unspecified: Secondary | ICD-10-CM

## 2024-03-15 DIAGNOSIS — M438X4 Other specified deforming dorsopathies, thoracic region: Secondary | ICD-10-CM | POA: Diagnosis not present

## 2024-03-15 DIAGNOSIS — N401 Enlarged prostate with lower urinary tract symptoms: Secondary | ICD-10-CM

## 2024-03-15 DIAGNOSIS — M47814 Spondylosis without myelopathy or radiculopathy, thoracic region: Secondary | ICD-10-CM | POA: Diagnosis not present

## 2024-03-15 LAB — LIPID PANEL
Cholesterol: 140 mg/dL (ref 0–200)
HDL: 52.3 mg/dL (ref >39.00)
LDL Cholesterol: 63 mg/dL (ref 0–99)
NonHDL: 87.32
Total CHOL/HDL Ratio: 3
Triglycerides: 120 mg/dL (ref 0.0–149.0)
VLDL: 24 mg/dL (ref 0.0–40.0)

## 2024-03-15 LAB — HEPATIC FUNCTION PANEL
ALT: 22 U/L (ref 0–53)
AST: 22 U/L (ref 0–37)
Albumin: 4.7 g/dL (ref 3.5–5.2)
Alkaline Phosphatase: 81 U/L (ref 39–117)
Bilirubin, Direct: 0.2 mg/dL (ref 0.0–0.3)
Total Bilirubin: 1.6 mg/dL — ABNORMAL HIGH (ref 0.2–1.2)
Total Protein: 7.2 g/dL (ref 6.0–8.3)

## 2024-03-15 LAB — CBC WITH DIFFERENTIAL/PLATELET
Basophils Absolute: 0 K/uL (ref 0.0–0.1)
Basophils Relative: 0.5 % (ref 0.0–3.0)
Eosinophils Absolute: 0.1 K/uL (ref 0.0–0.7)
Eosinophils Relative: 0.6 % (ref 0.0–5.0)
HCT: 46.5 % (ref 39.0–52.0)
Hemoglobin: 15.5 g/dL (ref 13.0–17.0)
Lymphocytes Relative: 19.2 % (ref 12.0–46.0)
Lymphs Abs: 1.7 K/uL (ref 0.7–4.0)
MCHC: 33.3 g/dL (ref 30.0–36.0)
MCV: 92.4 fl (ref 78.0–100.0)
Monocytes Absolute: 0.7 K/uL (ref 0.1–1.0)
Monocytes Relative: 7.9 % (ref 3.0–12.0)
Neutro Abs: 6.2 K/uL (ref 1.4–7.7)
Neutrophils Relative %: 71.8 % (ref 43.0–77.0)
Platelets: 208 K/uL (ref 150.0–400.0)
RBC: 5.03 Mil/uL (ref 4.22–5.81)
RDW: 12.7 % (ref 11.5–15.5)
WBC: 8.7 K/uL (ref 4.0–10.5)

## 2024-03-15 LAB — BASIC METABOLIC PANEL WITH GFR
BUN: 20 mg/dL (ref 6–23)
CO2: 25 meq/L (ref 19–32)
Calcium: 9.6 mg/dL (ref 8.4–10.5)
Chloride: 102 meq/L (ref 96–112)
Creatinine, Ser: 1.18 mg/dL (ref 0.40–1.50)
GFR: 60.48 mL/min (ref >60.00)
Glucose, Bld: 88 mg/dL (ref 70–99)
Potassium: 4.2 meq/L (ref 3.5–5.1)
Sodium: 138 meq/L (ref 135–145)

## 2024-03-15 LAB — TSH: TSH: 1.92 u[IU]/mL (ref 0.35–5.50)

## 2024-03-15 LAB — HEMOGLOBIN A1C: Hgb A1c MFr Bld: 6 % (ref 4.6–6.5)

## 2024-03-15 LAB — VITAMIN B12: Vitamin B-12: 1038 pg/mL — ABNORMAL HIGH (ref 211–911)

## 2024-03-15 LAB — PSA: PSA: 0.81 ng/mL (ref 0.10–4.00)

## 2024-03-15 LAB — LIPASE: Lipase: 42 U/L (ref 11.0–59.0)

## 2024-03-15 MED ORDER — FAMOTIDINE 20 MG PO TABS: 20.0000 mg | ORAL_TABLET | Freq: Two times a day (BID) | ORAL | Status: AC | PRN

## 2024-03-15 NOTE — Progress Notes (Signed)
 Subjective:    Patient ID: Christian Martin, male    DOB: [birth year], 73 y.o.   MRN: [id number]  HPI Here to follow up on issues. First he describes sharp pains in the center of his back that comes and go. These are worst when he gets out of bed in the morning. These started several months ago. No recent trauma. He has some Celebrex  at home that he uses for this with good relief. He had some trouble sleeping, but he started using OTC magnesium at bedtime. This has worked well. Finally he has frequent heartburn, and he uses OTC Omeprazole  occasionally. This works well for him, but he is concerned abut possible problems with long term use of this medication.    Review of Systems  Constitutional: Negative.   HENT: Negative.    Eyes: Negative.   Respiratory: Negative.    Cardiovascular: Negative.   Gastrointestinal: Negative.   Genitourinary: Negative.   Musculoskeletal:  Positive for back pain.  Skin: Negative.   Neurological: Negative.   Psychiatric/Behavioral: Negative.         Objective:   Physical Exam Constitutional:      General: He is not in acute distress.    Appearance: Normal appearance. He is well-developed. He is not diaphoretic.  HENT:     Head: Normocephalic and atraumatic.     Right Ear: External ear normal.     Left Ear: External ear normal.     Nose: Nose normal.     Mouth/Throat:     Pharynx: No oropharyngeal exudate.  Eyes:     General: No scleral icterus.       Right eye: No discharge.        Left eye: No discharge.     Conjunctiva/sclera: Conjunctivae normal.     Pupils: Pupils are equal, round, and reactive to light.  Neck:     Thyroid : No thyromegaly.     Vascular: No JVD.     Trachea: No tracheal deviation.  Cardiovascular:     Rate and Rhythm: Normal rate and regular rhythm.     Pulses: Normal pulses.     Heart sounds: Normal heart sounds. No murmur heard.    No friction rub. No gallop.  Pulmonary:     Effort: Pulmonary effort is normal. No  respiratory distress.     Breath sounds: Normal breath sounds. No wheezing or rales.  Chest:     Chest wall: No tenderness.  Abdominal:     General: Bowel sounds are normal. There is no distension.     Palpations: Abdomen is soft. There is no mass.     Tenderness: There is no abdominal tenderness. There is no guarding or rebound.  Genitourinary:    Penis: Normal. No tenderness.      Testes: Normal.  Musculoskeletal:        General: No tenderness. Normal range of motion.     Cervical back: Neck supple.  Lymphadenopathy:     Cervical: No cervical adenopathy.  Skin:    General: Skin is warm and dry.     Coloration: Skin is not pale.     Findings: No erythema or rash.  Neurological:     General: No focal deficit present.     Mental Status: He is alert and oriented to person, place, and time.     Cranial Nerves: No cranial nerve deficit.     Motor: No abnormal muscle tone.     Coordination: Coordination normal.  Deep Tendon Reflexes: Reflexes are normal and symmetric. Reflexes normal.  Psychiatric:        Mood and Affect: Mood normal.        Behavior: Behavior normal.        Thought Content: Thought content normal.        Judgment: Judgment normal.           Assessment & Plan:  He has some thoracic back pain, so we will get Xrays of his thoracic spine today. He can use Celebrex  as needed. For his GERD, he will stop Omeprazole  and he will try Famotidine  OTC. I explained that this appears to be safer long term. His neuropathy is stable, and his anxiety is stable. We spent a total of ( 33  ) minutes reviewing records and discussing these issues.  Garnette Olmsted, MD

## 2024-03-15 NOTE — Telephone Encounter (Signed)
 Pt was seen this morning for this

## 2024-03-16 ENCOUNTER — Ambulatory Visit: Payer: Self-pay | Admitting: Family Medicine

## 2024-03-19 ENCOUNTER — Encounter: Payer: Self-pay | Admitting: Family Medicine

## 2024-03-20 ENCOUNTER — Encounter: Payer: Self-pay | Admitting: Family Medicine

## 2024-03-23 NOTE — Telephone Encounter (Signed)
(  1) I am glad the dizziness is gone. Tell him to stay OFF the Lipitor at least for now, (2) his total bilirubin is only slightly above the normal range. This has been the case several times before. As long as the other liver levels are normal, this is nothing to worry about. (3) having a B12 level that is a little high is not dangerous. Having said that, he should look at the vitamins he is taking and make sure he gets no more than 1000 mcg a day

## 2024-03-23 NOTE — Telephone Encounter (Signed)
 His recent Xrays were of the thoracic (middle) spine, while the ones from 8/24 were of the lumbar (lower) spine

## 2024-03-31 ENCOUNTER — Encounter: Payer: Self-pay | Admitting: Family Medicine

## 2024-04-01 MED ORDER — AZITHROMYCIN 250 MG PO TABS: ORAL_TABLET | ORAL | 0 refills | Status: AC

## 2024-04-01 NOTE — Telephone Encounter (Signed)
 Please call in a Zpack. He can see us  if this does not take care of it

## 2024-05-01 ENCOUNTER — Encounter: Payer: Self-pay | Admitting: Family Medicine

## 2024-05-01 DIAGNOSIS — Z23 Encounter for immunization: Secondary | ICD-10-CM

## 2024-05-03 ENCOUNTER — Other Ambulatory Visit: Payer: Self-pay

## 2024-05-03 DIAGNOSIS — Z23 Encounter for immunization: Secondary | ICD-10-CM

## 2024-05-05 MED ORDER — COVID-19 MRNA VACC (MODERNA) 50 MCG/0.5ML IM SUSP: 0.5000 mL | Freq: Once | INTRAMUSCULAR | 0 refills | Status: AC

## 2024-05-06 ENCOUNTER — Other Ambulatory Visit: Payer: Self-pay

## 2024-05-06 MED ORDER — COVID-19MRNA BIVAL VACC PFIZER 30 MCG/0.3ML IM SUSP: 0.3000 mL | Freq: Once | INTRAMUSCULAR | 0 refills | Status: AC

## 2024-05-06 NOTE — Telephone Encounter (Signed)
 Spoke with pt voiced understanding Rx was sent to his pharmacy

## 2024-05-14 ENCOUNTER — Encounter: Payer: Self-pay | Admitting: Family Medicine

## 2024-05-17 MED ORDER — MECLIZINE HCL 25 MG PO TABS: 25.0000 mg | ORAL_TABLET | ORAL | 3 refills | Status: AC | PRN

## 2024-05-17 NOTE — Telephone Encounter (Signed)
 This is common for people of all ages. I sent in a RX for Meclizine  to use the next time it happens

## 2024-05-18 ENCOUNTER — Encounter: Payer: Self-pay | Admitting: Family Medicine

## 2024-05-20 NOTE — Telephone Encounter (Signed)
 Noted

## 2024-06-04 ENCOUNTER — Encounter: Payer: Self-pay | Admitting: Family Medicine

## 2024-06-11 ENCOUNTER — Other Ambulatory Visit: Payer: Self-pay | Admitting: Family Medicine

## 2024-10-01 ENCOUNTER — Ambulatory Visit: Payer: Medicare HMO

## 2024-10-01 VITALS — BP 120/64 | HR 83 | Temp 98.2°F | Ht 68.5 in | Wt 175.7 lb

## 2024-10-01 DIAGNOSIS — Z0000 Encounter for general adult medical examination without abnormal findings: Secondary | ICD-10-CM

## 2024-10-01 NOTE — Progress Notes (Signed)
 "  Chief Complaint  Patient presents with   Medicare Wellness     Subjective:   Christian Martin is a 74 y.o. male who presents for a Medicare Annual Wellness Visit.  Visit info / Clinical Intake: Medicare Wellness Visit Type:: Subsequent Annual Wellness Visit Persons participating in visit and providing information:: patient Medicare Wellness Visit Mode:: In-person (required for WTM) Interpreter Needed?: No Pre-visit prep was completed: yes AWV questionnaire completed by patient prior to visit?: yes Date:: 09/27/24 Living arrangements:: (!) lives alone Patient's Overall Health Status Rating: very good Typical amount of pain: some Does pain affect daily life?: no Are you currently prescribed opioids?: no  Dietary Habits and Nutritional Risks How many meals a day?: 2 Eats fruit and vegetables daily?: yes Most meals are obtained by: preparing own meals In the last 2 weeks, have you had any of the following?: none Diabetic:: no  Functional Status Activities of Daily Living (to include ambulation/medication): Independent Ambulation: Independent with device- listed below Home Assistive Devices/Equipment: Eyeglasses Medication Administration: Independent Home Management (perform basic housework or laundry): Independent Manage your own finances?: yes Primary transportation is: driving Concerns about vision?: no *vision screening is required for WTM* Concerns about hearing?: no  Fall Screening Falls in the past year?: 0 Number of falls in past year: 0 Was there an injury with Fall?: 0 Fall Risk Category Calculator: 0 Patient Fall Risk Level: Low Fall Risk  Fall Risk Patient at Risk for Falls Due to: No Fall Risks Fall risk Follow up: Falls evaluation completed  Home and Transportation Safety: All rugs have non-skid backing?: (!) no All stairs or steps have railings?: N/A, no stairs Grab bars in the bathtub or shower?: (!) no Have non-skid surface in bathtub or shower?:  (!) no Good home lighting?: yes Regular seat belt use?: yes Hospital stays in the last year:: no  Cognitive Assessment Difficulty concentrating, remembering, or making decisions? : no Will 6CIT or Mini Cog be Completed: yes What year is it?: 0 points What month is it?: 0 points Give patient an address phrase to remember (5 components): [address] [place]  About what time is it?: 0 points Count backwards from 20 to 1: 0 points Say the months of the year in reverse: 0 points Repeat the address phrase from earlier: 0 points 6 CIT Score: 0 points  Advance Directives (For Healthcare) Does Patient Have a Medical Advance Directive?: Yes Does patient want to make changes to medical advance directive?: No - Patient declined Type of Advance Directive: Healthcare Power of [place]; Living will Copy of Healthcare Power of Attorney in Chart?: Yes - validated most recent copy scanned in chart (See row information) Copy of Living Will in Chart?: Yes - validated most recent copy scanned in chart (See row information)  Reviewed/Updated  Reviewed/Updated: Reviewed All (Medical, Surgical, Family, Medications, Allergies, Care Teams, Patient Goals)    Allergies (verified) Penicillins and Trazodone  and nefazodone   Current Medications (verified) Outpatient Encounter Medications as of 10/01/2024  Medication Sig   Magnesium Glycinate 120 MG CAPS Take 120 mg by mouth daily.   ALFALFA PO Take by mouth daily. 2.4 grams   APPLE CIDER VINEGAR PO Take by mouth daily.   atorvastatin  (LIPITOR) 20 MG tablet TAKE 1 TABLET BY MOUTH EVERY DAY   Cholecalciferol (VITAMIN D PO) Take by mouth.   ELDERBERRY PO Take by mouth daily.   famotidine  (PEPCID ) 20 MG tablet Take 1 tablet (20 mg total) by mouth 2 (two) times daily  as needed for heartburn or indigestion.   fish oil-omega-3 fatty acids 1000 MG capsule Take 2 g by mouth daily.   Garlic 1000 MG CAPS Take by mouth.   meclizine  (ANTIVERT ) 25 MG tablet  Take 1 tablet (25 mg total) by mouth every 4 (four) hours as needed for dizziness. (Patient not taking: Reported on 10/01/2024)   MILK THISTLE PO Take by mouth daily.   Probiotic Product (PROBIOTIC PO) Take by mouth daily.   sodium chloride  (OCEAN) 0.65 % SOLN nasal spray Place 1 spray into both nostrils as needed for congestion.   triamcinolone (NASACORT ALLERGY 24HR) 55 MCG/ACT AERO nasal inhaler Place 2 sprays into the nose daily. As needed   UBIQUINOL PO Take by mouth daily.   No facility-administered encounter medications on file as of 10/01/2024.    History: Past Medical History:  Diagnosis Date   Anxiety    Occasional   Depression    Mild   GERD (gastroesophageal reflux disease) 2024   mild   Hx of adenomatous colonic polyps    Hyperlipidemia    Retinal tear of right eye 08/26/2014   saw Dr. Charmayne    Past Surgical History:  Procedure Laterality Date   COLONOSCOPY  11/09/2019   per Dr. Leigh, adenomatous polyp, repeat in 7 yrs    COSMETIC SURGERY  10/2021   Septoplasty and turbinate reduction   LACERATION REPAIR     Please update my chart: 01-18-18 Lacerated forehead wound 1.5 treated with 6 sutures at [facility] on [address].    NASAL SEPTOPLASTY W/ TURBINOPLASTY Bilateral 11/09/2021   per Dr. Norleen Getting in [place]   WISDOM TOOTH EXTRACTION     Family History  Problem Relation Age of Onset   Cancer Sister        breast   Osteoporosis Mother    Hypertension Mother    Heart failure Father    Heart disease Father    Breast cancer Other    Coronary artery disease Other    Colon cancer Neg Hx    Esophageal cancer Neg Hx    Rectal cancer Neg Hx    Stomach cancer Neg Hx    Social History   Occupational History   Not on file  Tobacco Use   Smoking status: Never   Smokeless tobacco: Never  Substance and Sexual Activity   Alcohol use: Not Currently    Comment: glass of wine each day   Drug use: Never   Sexual activity: Not Currently   Tobacco  Counseling Counseling given: No  SDOH Screenings   Food Insecurity: No Food Insecurity (10/01/2024)  Housing: Low Risk (10/01/2024)  Transportation Needs: No Transportation Needs (10/01/2024)  Utilities: Not At Risk (10/01/2024)  Alcohol Screen: Low Risk (09/26/2023)  Depression (PHQ2-9): Low Risk (10/01/2024)  Financial Resource Strain: Low Risk (09/27/2024)  Physical Activity: Sufficiently Active (10/01/2024)  Social Connections: Socially Isolated (10/01/2024)  Stress: No Stress Concern Present (10/01/2024)  Tobacco Use: Low Risk (10/01/2024)  Health Literacy: Adequate Health Literacy (10/01/2024)   See flowsheets for full screening details  Depression Screen PHQ 2 & 9 Depression Scale- Over the past 2 weeks, how often have you been bothered by any of the following problems? Little interest or pleasure in doing things: 0 Feeling down, depressed, or hopeless (PHQ Adolescent also includes...irritable): 0 PHQ-2 Total Score: 0     Goals Addressed               This Visit's Progress     Increase  physical activity (pt-stated)        Continue exercising and stay active!             Objective:    Today's Vitals   10/01/24 1523  BP: 120/64  Pulse: 83  Temp: 98.2 F (36.8 C)  TempSrc: Oral  SpO2: 91%  Weight: 175 lb 11.2 oz (79.7 kg)  Height: 5' 8.5 (1.74 m)   Body mass index is 26.33 kg/m.  Hearing/Vision screen Hearing Screening - Comments:: Denies hearing difficulties   Vision Screening - Comments:: Wears rx glasses - up to date with routine eye exams with  [facility] Immunizations and Health Maintenance Health Maintenance  Topic Date Due   COVID-19 Vaccine (10 - 2025-26 season) 11/15/2024   Medicare Annual Wellness (AWV)  10/01/2025   DTaP/Tdap/Td (2 - Td or Tdap) 05/06/2026   Colonoscopy  11/08/2029   Pneumococcal Vaccine: 50+ Years  Completed   Influenza Vaccine  Completed   Hepatitis C Screening  Completed   Zoster Vaccines- Shingrix  Completed   Meningococcal B  Vaccine  Aged Out        Assessment/Plan:  This is a routine wellness examination for Lennart.  Patient Care Team: Johnny Garnette LABOR, MD as PCP - General Liane, Sharyne MATSU, [facility] (Inactive) as Pharmacist (Pharmacist)  I have personally reviewed and noted the following in the patients chart:   Medical and social history Use of alcohol, tobacco or illicit drugs  Current medications and supplements including opioid prescriptions. Functional ability and status Nutritional status Physical activity Advanced directives List of other physicians Hospitalizations, surgeries, and ER visits in previous 12 months Vitals Screenings to include cognitive, depression, and falls Referrals and appointments  No orders of the defined types were placed in this encounter.  In addition, I have reviewed and discussed with patient certain preventive protocols, quality metrics, and best practice recommendations. A written personalized care plan for preventive services as well as general preventive health recommendations were provided to patient.   Rojelio LELON Blush, LPN   02/26/7972   Return in 53 weeks (on 10/07/2025).  After Visit Summary: (In Person-Printed) AVS printed and given to the patient  Nurse Notes: No voiced or noted concerns at this time "

## 2024-10-01 NOTE — Patient Instructions (Addendum)
 Christian Martin,  Thank you for taking the time for your Medicare Wellness Visit. I appreciate your continued commitment to your health goals. Please review the care plan we discussed, and feel free to reach out if I can assist you further.  Please note that Annual Wellness Visits do not include a physical exam. Some assessments may be limited, especially if the visit was conducted virtually. If needed, we may recommend an in-person follow-up with your provider.  Ongoing Care Seeing your primary care provider every 3 to 6 months helps us  monitor your health and provide consistent, personalized care.   Referrals If a referral was made during today's visit and you haven't received any updates within two weeks, please contact the referred provider directly to check on the status.  Recommended Screenings:  Health Maintenance  Topic Date Due   COVID-19 Vaccine (10 - 2025-26 season) 11/15/2024   Medicare Annual Wellness Visit  10/01/2025   DTaP/Tdap/Td vaccine (2 - Td or Tdap) 05/06/2026   Colon Cancer Screening  11/08/2029   Pneumococcal Vaccine for age over 55  Completed   Flu Shot  Completed   Hepatitis C Screening  Completed   Zoster (Shingles) Vaccine  Completed   Meningitis B Vaccine  Aged Out       10/01/2024    3:35 PM  Advanced Directives  Does Patient Have a Medical Advance Directive? Yes  Type of Estate Agent of [place];Living will  Does patient want to make changes to medical advance directive? No - Patient declined  Copy of Healthcare Power of Attorney in Chart? Yes - validated most recent copy scanned in chart (See row information)    Vision: Annual vision screenings are recommended for early detection of glaucoma, cataracts, and diabetic retinopathy. These exams can also reveal signs of chronic conditions such as diabetes and high blood pressure.  Dental: Annual dental screenings help detect early signs of oral cancer, gum disease, and other conditions  linked to overall health, including heart disease and diabetes.  Please see the attached documents for additional preventive care recommendations.

## 2025-10-07 ENCOUNTER — Ambulatory Visit
